# Patient Record
Sex: Female | Born: 1960 | Race: White | Hispanic: No | Marital: Married | Smoking: Current every day smoker
Health system: Southern US, Community
[De-identification: ages and names within clinical notes are randomized; demographics above are authoritative.]

## PROBLEM LIST (undated history)

## (undated) DIAGNOSIS — M25569 Pain in unspecified knee: Secondary | ICD-10-CM

## (undated) HISTORY — PX: LAPAROSCOPIC TUBAL LIGATION: SUR803

---

## 2015-03-29 ENCOUNTER — Emergency Department (HOSPITAL_COMMUNITY): Payer: 59

## 2015-03-29 ENCOUNTER — Encounter (HOSPITAL_COMMUNITY): Payer: Self-pay | Admitting: Emergency Medicine

## 2015-03-29 ENCOUNTER — Inpatient Hospital Stay (HOSPITAL_COMMUNITY)
Admission: EM | Admit: 2015-03-29 | Discharge: 2015-03-31 | DRG: 340 | Disposition: A | Discharge status: Home / Self Care | Payer: 59 | Attending: Surgery | Admitting: Surgery

## 2015-03-29 DIAGNOSIS — K8020 Calculus of gallbladder without cholecystitis without obstruction: Secondary | ICD-10-CM | POA: Diagnosis present

## 2015-03-29 DIAGNOSIS — K3580 Unspecified acute appendicitis: Secondary | ICD-10-CM | POA: Diagnosis present

## 2015-03-29 DIAGNOSIS — K3532 Acute appendicitis with perforation and localized peritonitis, without abscess: Secondary | ICD-10-CM | POA: Diagnosis present

## 2015-03-29 DIAGNOSIS — F17210 Nicotine dependence, cigarettes, uncomplicated: Secondary | ICD-10-CM | POA: Diagnosis present

## 2015-03-29 DIAGNOSIS — K352 Acute appendicitis with generalized peritonitis: Principal | ICD-10-CM | POA: Diagnosis present

## 2015-03-29 DIAGNOSIS — N83202 Unspecified ovarian cyst, left side: Secondary | ICD-10-CM | POA: Diagnosis present

## 2015-03-29 DIAGNOSIS — R1031 Right lower quadrant pain: Secondary | ICD-10-CM | POA: Diagnosis not present

## 2015-03-29 HISTORY — DX: Pain in unspecified knee: M25.569

## 2015-03-29 LAB — CBC
HCT: 43.4 % (ref 36.0–46.0)
Hemoglobin: 15.2 g/dL — ABNORMAL HIGH (ref 12.0–15.0)
MCH: 30.8 pg (ref 26.0–34.0)
MCHC: 35 g/dL (ref 30.0–36.0)
MCV: 88 fL (ref 78.0–100.0)
PLATELETS: 204 10*3/uL (ref 150–400)
RBC: 4.93 MIL/uL (ref 3.87–5.11)
RDW: 13.4 % (ref 11.5–15.5)
WBC: 18.1 10*3/uL — AB (ref 4.0–10.5)

## 2015-03-29 LAB — URINALYSIS, ROUTINE W REFLEX MICROSCOPIC
Bilirubin Urine: NEGATIVE
Glucose, UA: NEGATIVE mg/dL
KETONES UR: NEGATIVE mg/dL
NITRITE: NEGATIVE
PROTEIN: NEGATIVE mg/dL
Specific Gravity, Urine: 1.018 (ref 1.005–1.030)
pH: 6 (ref 5.0–8.0)

## 2015-03-29 LAB — COMPREHENSIVE METABOLIC PANEL
ALBUMIN: 4.2 g/dL (ref 3.5–5.0)
ALK PHOS: 72 U/L (ref 38–126)
ALT: 27 U/L (ref 14–54)
ANION GAP: 9 (ref 5–15)
AST: 23 U/L (ref 15–41)
BUN: 13 mg/dL (ref 6–20)
CALCIUM: 9.8 mg/dL (ref 8.9–10.3)
CO2: 29 mmol/L (ref 22–32)
Chloride: 98 mmol/L — ABNORMAL LOW (ref 101–111)
Creatinine, Ser: 0.73 mg/dL (ref 0.44–1.00)
GFR calc Af Amer: >60 mL/min (ref >60)
GFR calc non Af Amer: >60 mL/min (ref >60)
GLUCOSE: 107 mg/dL — AB (ref 65–99)
Potassium: 3.4 mmol/L — ABNORMAL LOW (ref 3.5–5.1)
SODIUM: 136 mmol/L (ref 135–145)
TOTAL PROTEIN: 8.3 g/dL — AB (ref 6.5–8.1)
Total Bilirubin: 1.1 mg/dL (ref 0.3–1.2)

## 2015-03-29 LAB — URINE MICROSCOPIC-ADD ON

## 2015-03-29 LAB — LIPASE, BLOOD: Lipase: 24 U/L (ref 11–51)

## 2015-03-29 MED ORDER — DIPHENHYDRAMINE HCL 50 MG/ML IJ SOLN: 12.5000 mg | Freq: Four times a day (QID) | INTRAMUSCULAR | Status: DC | PRN

## 2015-03-29 MED ORDER — PROMETHAZINE HCL 25 MG/ML IJ SOLN: 6.2500 mg | INTRAMUSCULAR | Status: DC | PRN

## 2015-03-29 MED ORDER — PHENOL 1.4 % MT LIQD: 2.0000 | OROMUCOSAL | Status: DC | PRN

## 2015-03-29 MED ORDER — HEPARIN SODIUM (PORCINE) 5000 UNIT/ML IJ SOLN
5000.0000 [IU] | Freq: Three times a day (TID) | INTRAMUSCULAR | Status: DC
  Administered 2015-03-29 – 2015-03-31 (×3): 5000 [IU] via SUBCUTANEOUS
  Filled 2015-03-29 (×8): qty 1

## 2015-03-29 MED ORDER — MAGIC MOUTHWASH
15.0000 mL | Freq: Four times a day (QID) | ORAL | Status: DC | PRN
  Filled 2015-03-29: qty 15

## 2015-03-29 MED ORDER — SODIUM CHLORIDE 0.9 % IV BOLUS (SEPSIS)
1000.0000 mL | Freq: Once | INTRAVENOUS | Status: AC
  Administered 2015-03-29: 1000 mL via INTRAVENOUS

## 2015-03-29 MED ORDER — DEXTROSE 5 % IV SOLN
2.0000 g | INTRAVENOUS | Status: DC
  Administered 2015-03-29 – 2015-03-30 (×2): 2 g via INTRAVENOUS
  Filled 2015-03-29 (×2): qty 2

## 2015-03-29 MED ORDER — METRONIDAZOLE IN NACL 5-0.79 MG/ML-% IV SOLN
500.0000 mg | Freq: Three times a day (TID) | INTRAVENOUS | Status: DC
  Administered 2015-03-29 – 2015-03-31 (×4): 500 mg via INTRAVENOUS
  Filled 2015-03-29 (×6): qty 100

## 2015-03-29 MED ORDER — ALUM & MAG HYDROXIDE-SIMETH 200-200-20 MG/5ML PO SUSP: 30.0000 mL | Freq: Four times a day (QID) | ORAL | Status: DC | PRN

## 2015-03-29 MED ORDER — CHLORHEXIDINE GLUCONATE 4 % EX LIQD
1.0000 "application " | Freq: Once | CUTANEOUS | Status: AC
  Administered 2015-03-29: 1 via TOPICAL

## 2015-03-29 MED ORDER — ONDANSETRON HCL 4 MG/2ML IJ SOLN
4.0000 mg | Freq: Four times a day (QID) | INTRAMUSCULAR | Status: DC | PRN
  Administered 2015-03-30 (×2): 4 mg via INTRAVENOUS
  Filled 2015-03-29: qty 2

## 2015-03-29 MED ORDER — METOPROLOL TARTRATE 12.5 MG HALF TABLET
12.5000 mg | ORAL_TABLET | Freq: Two times a day (BID) | ORAL | Status: DC | PRN
  Filled 2015-03-29: qty 1

## 2015-03-29 MED ORDER — SODIUM CHLORIDE 0.9 % IV SOLN
8.0000 mg | Freq: Four times a day (QID) | INTRAVENOUS | Status: DC | PRN
  Filled 2015-03-29: qty 4

## 2015-03-29 MED ORDER — CHLORHEXIDINE GLUCONATE 4 % EX LIQD
1.0000 "application " | Freq: Once | CUTANEOUS | Status: AC
  Administered 2015-03-30: 1 via TOPICAL

## 2015-03-29 MED ORDER — ONDANSETRON 4 MG PO TBDP: 4.0000 mg | ORAL_TABLET | Freq: Four times a day (QID) | ORAL | Status: DC | PRN

## 2015-03-29 MED ORDER — GUAIFENESIN-DM 100-10 MG/5ML PO SYRP: 15.0000 mL | ORAL_SOLUTION | ORAL | Status: DC | PRN

## 2015-03-29 MED ORDER — SACCHAROMYCES BOULARDII 250 MG PO CAPS
250.0000 mg | ORAL_CAPSULE | Freq: Two times a day (BID) | ORAL | Status: DC
  Administered 2015-03-29 – 2015-03-31 (×3): 250 mg via ORAL
  Filled 2015-03-29 (×5): qty 1

## 2015-03-29 MED ORDER — HYDROMORPHONE HCL 1 MG/ML IJ SOLN
0.5000 mg | INTRAMUSCULAR | Status: DC | PRN
  Administered 2015-03-29: 1 mg via INTRAVENOUS
  Filled 2015-03-29: qty 2

## 2015-03-29 MED ORDER — IOHEXOL 300 MG/ML  SOLN
100.0000 mL | Freq: Once | INTRAMUSCULAR | Status: AC | PRN
Start: 2015-03-29 — End: 2015-03-29
  Administered 2015-03-29: 100 mL via INTRAVENOUS

## 2015-03-29 MED ORDER — ACETAMINOPHEN 500 MG PO TABS
1000.0000 mg | ORAL_TABLET | Freq: Three times a day (TID) | ORAL | Status: DC
  Administered 2015-03-29 – 2015-03-31 (×4): 1000 mg via ORAL
  Filled 2015-03-29 (×7): qty 2

## 2015-03-29 MED ORDER — MENTHOL 3 MG MT LOZG: 1.0000 | LOZENGE | OROMUCOSAL | Status: DC | PRN

## 2015-03-29 MED ORDER — ONDANSETRON HCL 4 MG/2ML IJ SOLN
4.0000 mg | Freq: Once | INTRAMUSCULAR | Status: DC
  Filled 2015-03-29 (×3): qty 2

## 2015-03-29 MED ORDER — LIP MEDEX EX OINT
1.0000 "application " | TOPICAL_OINTMENT | Freq: Two times a day (BID) | CUTANEOUS | Status: DC
  Administered 2015-03-29 – 2015-03-31 (×2): 1 via TOPICAL
  Filled 2015-03-29: qty 7

## 2015-03-29 MED ORDER — OXYCODONE HCL 5 MG PO TABS: 5.0000 mg | ORAL_TABLET | ORAL | Status: DC | PRN

## 2015-03-29 MED ORDER — ZOLPIDEM TARTRATE 5 MG PO TABS: 5.0000 mg | ORAL_TABLET | Freq: Every evening | ORAL | Status: DC | PRN

## 2015-03-29 MED ORDER — METOPROLOL TARTRATE 1 MG/ML IV SOLN
5.0000 mg | Freq: Four times a day (QID) | INTRAVENOUS | Status: DC | PRN
  Filled 2015-03-29: qty 5

## 2015-03-29 NOTE — ED Notes (Signed)
Pt denies nausea; refuses zofran.

## 2015-03-29 NOTE — H&P (Signed)
[address] [place], [place] [address] Phone: [phone] FAX: [phone]     Ann Reyes  1960-09-19 [id number]  CARE TEAM:  PCP: Michel Harrow, PA-C  Outpatient Care Team: Patient Care Team: Michel Harrow, PA-C as PCP - General (Physician Assistant)  Inpatient Treatment Team: Treatment Team: Attending Provider: Nolon Nations, MD; Technician: Tobie Poet, NT; Technician: Loman Brooklyn, EMT; Registered Nurse: Justice Rocher, RN  This patient is a 54 y.o.female who presents today for surgical evaluation at the request of Dr. Dayna Barker  Reason for evaluation: Acute appendicitis.  Healthy wound.  Usually does not see doctors.  Has had a few days of vague abdominal pain.  Usually not abdomen.  Nausea and vomiting.  Became more focused in the lower abdomen.  More right-sided.  Was concerned.  Gait emergency room.  Physical exam concerning.  CT scan suspicious for acute appendicitis.  Consultation for surgery requested.  No personal nor family history of GI/colon cancer, inflammatory bowel disease, irritable bowel syndrome, allergy such as Celiac Sprue, dietary/dairy problems, colitis, ulcers nor gastritis.  No recent sick contacts/gastroenteritis.  No travel outside the country.  No changes in diet.  No dysphagia to solids or liquids.  No significant heartburn or reflux.  No hematochezia, hematemesis, coffee ground emesis.  No evidence of prior gastric/peptic ulceration.   No exertional chest/neck/shoulder/arm pain.  Patient can walk 60 minutes for about 2 miles without difficulty.  Does not see doctors.  Setting up a PCP     Past Medical History  Diagnosis Date  . Knee pain     Past Surgical History  Procedure Laterality Date  . No past surgeries      Social History   Social History  . Marital Status: Married    Spouse Name: N/A  . Number of Children: N/A  . Years of Education: N/A   Occupational  History  . Not on file.   Social History Main Topics  . Smoking status: Current Every Day Smoker -- 1.00 packs/day for 20 years    Types: Cigarettes  . Smokeless tobacco: Never Used  . Alcohol Use: No  . Drug Use: No  . Sexual Activity: Not on file   Other Topics Concern  . Not on file   Social History Narrative  . No narrative on file    History reviewed. No pertinent family history.  Current Facility-Administered Medications  Medication Dose Route Frequency Provider Last Rate Last Dose  . acetaminophen (TYLENOL) tablet 1,000 mg  1,000 mg Oral TID Michael Boston, MD      . alum & mag hydroxide-simeth (MAALOX/MYLANTA) 200-200-20 MG/5ML suspension 30 mL  30 mL Oral Q6H PRN Michael Boston, MD      . cefTRIAXone (ROCEPHIN) 2 g in dextrose 5 % 50 mL IVPB  2 g Intravenous Q24H Michael Boston, MD       And  . metroNIDAZOLE (FLAGYL) IVPB 500 mg  500 mg Intravenous Q8H Michael Boston, MD      . chlorhexidine (HIBICLENS) 4 % liquid 1 application  1 application Topical Once Michael Boston, MD      . Derrill Memo ON 03/30/2015] chlorhexidine (HIBICLENS) 4 % liquid 1 application  1 application Topical Once Michael Boston, MD      . diphenhydrAMINE (BENADRYL) injection 12.5-25 mg  12.5-25 mg Intravenous Q6H PRN Michael Boston, MD      . guaiFENesin-dextromethorphan (ROBITUSSIN DM) 100-10 MG/5ML syrup 15 mL  15  mL Oral Q4H PRN Michael Boston, MD      . heparin injection 5,000 Units  5,000 Units Subcutaneous 3 times per day Michael Boston, MD      . HYDROmorphone (DILAUDID) injection 0.5-2 mg  0.5-2 mg Intravenous Q1H PRN Michael Boston, MD      . lip balm (CARMEX) ointment 1 application  1 application Topical BID Michael Boston, MD      . magic mouthwash  15 mL Oral QID PRN Michael Boston, MD      . menthol-cetylpyridinium (CEPACOL) lozenge 3 mg  1 lozenge Oral PRN Michael Boston, MD      . metoprolol (LOPRESSOR) injection 5 mg  5 mg Intravenous Q6H PRN Michael Boston, MD      . metoprolol tartrate (LOPRESSOR) tablet 12.5 mg   12.5 mg Oral Q12H PRN Michael Boston, MD      . ondansetron [facility]) injection 4 mg  4 mg Intravenous Q6H PRN Michael Boston, MD       Or  . ondansetron (ZOFRAN) 8 mg in sodium chloride 0.9 % 50 mL IVPB  8 mg Intravenous Q6H PRN Michael Boston, MD      . ondansetron [facility]) injection 4 mg  4 mg Intravenous Once Merrily Pew, MD   4 mg at 03/29/15 1834  . ondansetron (ZOFRAN-ODT) disintegrating tablet 4-8 mg  4-8 mg Oral Q6H PRN Michael Boston, MD      . oxyCODONE (Oxy IR/ROXICODONE) immediate release tablet 5-10 mg  5-10 mg Oral Q4H PRN Michael Boston, MD      . phenol (CHLORASEPTIC) mouth spray 2 spray  2 spray Mouth/Throat PRN Michael Boston, MD      . promethazine (PHENERGAN) injection 6.25-12.5 mg  6.25-12.5 mg Intravenous Q4H PRN Michael Boston, MD      . saccharomyces boulardii (FLORASTOR) capsule 250 mg  250 mg Oral BID Michael Boston, MD      . zolpidem [facility]) tablet 5-10 mg  5-10 mg Oral QHS PRN Michael Boston, MD       No current outpatient prescriptions on file.     No Known Allergies  ROS: Constitutional:  No fevers, chills, sweats.  Weight stable Eyes:  No vision changes, No discharge HENT:  No sore throats, nasal drainage Lymph: No neck swelling, No bruising easily Pulmonary:  No cough, productive sputum CV: No orthopnea, PND  Patient walks 60 minutes for about 2 miles without difficulty.  No exertional chest/neck/shoulder/arm pain. GI:  No personal nor family history of GI/colon cancer, inflammatory bowel disease, irritable bowel syndrome, allergy such as Celiac Sprue, dietary/dairy problems, colitis, ulcers nor gastritis.  No recent sick contacts/gastroenteritis.  No travel outside the country.  No changes in diet. Renal: No UTIs, No hematuria Genital:  No drainage, bleeding, masses Musculoskeletal: No severe joint pain.  Good ROM major joints Skin:  No sores or lesions.  No rashes Heme/Lymph:  No easy bleeding.  No swollen lymph nodes Neuro: No focal weakness/numbness.  No  seizures Psych: No suicidal ideation.  No hallucinations  BP 90/79 mmHg  Pulse 94  Temp(Src) 98.9 F (37.2 C) (Oral)  Resp 16  Ht 5' 2"  (1.575 m)  Wt 74.844 kg (165 lb)  BMI 30.17 kg/m2  SpO2 96%  Physical Exam: General: Pt awake/alert/oriented x4 in  no major acute distress Eyes: PERRL, normal EOM. Sclera nonicteric Neuro: CN II-XII intact w/o focal sensory/motor deficits. Lymph: No head/neck/groin lymphadenopathy Psych:  No delerium/psychosis/paranoia HENT: Normocephalic, Mucus membranes moist.  No thrush Neck: Supple, No tracheal  deviation Chest: No pain.  Good respiratory excursion. CV:  Pulses intact.   Regular rhythm Abdomen: Soft, Nondistended.  TTP RLQ at McBurney's point w guarding & pain with cough/bedshake.  Rest of abdomen nontender.  No incarcerated hernias. Ext:  SCDs BLE.  No significant edema.  No cyanosis Skin: No petechiae / purpurea.  No major sores Musculoskeletal: No severe joint pain.  Good ROM major joints   Results:   Labs: Results for orders placed or performed during the hospital encounter of 03/29/15 (from the past 48 hour(s))  Lipase, blood     Status: None   Collection Time: 03/29/15  5:00 PM  Result Value Ref Range   Lipase 24 11 - 51 U/L  Comprehensive metabolic panel     Status: Abnormal   Collection Time: 03/29/15  5:00 PM  Result Value Ref Range   Sodium 136 135 - 145 mmol/L   Potassium 3.4 (L) 3.5 - 5.1 mmol/L   Chloride 98 (L) 101 - 111 mmol/L   CO2 29 22 - 32 mmol/L   Glucose, Bld 107 (H) 65 - 99 mg/dL   BUN 13 6 - 20 mg/dL   Creatinine, Ser 0.73 0.44 - 1.00 mg/dL   Calcium 9.8 8.9 - 10.3 mg/dL   Total Protein 8.3 (H) 6.5 - 8.1 g/dL   Albumin 4.2 3.5 - 5.0 g/dL   AST 23 15 - 41 U/L   ALT 27 14 - 54 U/L   Alkaline Phosphatase 72 38 - 126 U/L   Total Bilirubin 1.1 0.3 - 1.2 mg/dL   GFR calc non Af Amer >60 >60 mL/min   GFR calc Af Amer >60 >60 mL/min    Comment: (NOTE) The eGFR has been calculated using the CKD EPI  equation. This calculation has not been validated in all clinical situations. eGFR's persistently <60 mL/min signify possible Chronic Kidney Disease.    Anion gap 9 5 - 15  CBC     Status: Abnormal   Collection Time: 03/29/15  5:00 PM  Result Value Ref Range   WBC 18.1 (H) 4.0 - 10.5 K/uL   RBC 4.93 3.87 - 5.11 MIL/uL   Hemoglobin 15.2 (H) 12.0 - 15.0 g/dL   HCT 43.4 36.0 - 46.0 %   MCV 88.0 78.0 - 100.0 fL   MCH 30.8 26.0 - 34.0 pg   MCHC 35.0 30.0 - 36.0 g/dL   RDW 13.4 11.5 - 15.5 %   Platelets 204 150 - 400 K/uL  Urinalysis, Routine w reflex microscopic (not at [facility])     Status: Abnormal   Collection Time: 03/29/15  7:42 PM  Result Value Ref Range   Color, Urine YELLOW YELLOW   APPearance CLEAR CLEAR   Specific Gravity, Urine 1.018 1.005 - 1.030   pH 6.0 5.0 - 8.0   Glucose, UA NEGATIVE NEGATIVE mg/dL   Hgb urine dipstick MODERATE (A) NEGATIVE   Bilirubin Urine NEGATIVE NEGATIVE   Ketones, ur NEGATIVE NEGATIVE mg/dL   Protein, ur NEGATIVE NEGATIVE mg/dL   Nitrite NEGATIVE NEGATIVE   Leukocytes, UA SMALL (A) NEGATIVE  Urine microscopic-add on     Status: Abnormal   Collection Time: 03/29/15  7:42 PM  Result Value Ref Range   Squamous Epithelial / LPF 6-30 (A) NONE SEEN   WBC, UA 6-30 0 - 5 WBC/hpf   RBC / HPF 0-5 0 - 5 RBC/hpf   Bacteria, UA FEW (A) NONE SEEN    Imaging / Studies: Ct Abdomen Pelvis W Contrast  03/29/2015  CLINICAL DATA:  Central abdominal pain shifting to the right lower quadrant. Elevated white blood cell count. Concern for appendicitis. EXAM: CT ABDOMEN AND PELVIS WITH CONTRAST TECHNIQUE: Multidetector CT imaging of the abdomen and pelvis was performed using the standard protocol following bolus administration of intravenous contrast. CONTRAST:  171m OMNIPAQUE IOHEXOL 300 MG/ML  SOLN COMPARISON:  None. FINDINGS: Lower chest:  The included lung bases are clear. Liver: Mild steatosis. Subcapsular 1.7 cm peripherally enhancing lesion in the right lobe  consistent with hemangioma. 81.4 cm hypodense lesion adjacent the gallbladder fossa with peripheral nodular enhancement, also likely hemangioma. Smaller subcentimeter hypodensities scattered throughout the liver parenchyma, nonspecific. Hepatobiliary: Gallbladder physiologically distended. At least 2 lamellated gallstones within the gallbladder fossa. No pericholecystic inflammatory change. No biliary dilatation. Pancreas: Normal. Spleen: Normal. Adrenal glands: No nodule. Kidneys: Symmetric renal enhancement. No hydronephrosis. Mild prominence of the right ureter, likely reactive. Tiny subcapsular hypodensity in the interpolar right kidney, too small to characterize. Stomach/Bowel: Stomach physiologically distended. There are no dilated or thickened small bowel loops. Small volume of stool throughout the colon without colonic wall thickening. Scattered diverticulosis throughout the distal colon without diverticulitis. Appendix: Distended measuring up to 12 mm with questionable appendicolith at the base. There is moderate surrounding periappendiceal fat stranding and small amount of free fluid. Small foci of air within the appendiceal lumen without frank perforation or extraluminal air. No abscess. Vascular/Lymphatic: No retroperitoneal adenopathy. Abdominal aorta is normal in caliber. Mild atherosclerosis without aneurysm. Reproductive: Uterus is normal in size. Left ovarian enlargement measuring 4.3 x 3.2 cm with septated cyst versus adjacent cysts. Right ovary normal in size. Bladder: Physiologically distended without wall thickening. Other: No free air or intra-abdominal fluid collection. Tiny fat containing umbilical hernia. Musculoskeletal: There are no acute or suspicious osseous abnormalities. Facet arthropathy in the lumbar spine. IMPRESSION: 1. Acute appendicitis.  No abscess or perforation. 2. Left ovarian enlargement with septated cyst versus adjacent cysts. Recommend pelvic ultrasound for further  characterization after acute illness has resolved. 3. Incidental findings include cholelithiasis, no findings of acute cholecystitis. Diverticulosis. Hepatic hemangiomas. Electronically Signed   By: MJeb LeveringM.D.   On: 03/29/2015 19:38    Medications / Allergies: per chart  Antibiotics: Anti-infectives    Start     Dose/Rate Route Frequency Ordered Stop   03/29/15 2045  cefTRIAXone (ROCEPHIN) 2 g in dextrose 5 % 50 mL IVPB     2 g 100 mL/hr over 30 Minutes Intravenous Every 24 hours 03/29/15 2030     03/29/15 2045  metroNIDAZOLE (FLAGYL) IVPB 500 mg     500 mg 100 mL/hr over 60 Minutes Intravenous Every 8 hours 03/29/15 2030        Assessment  Ann Reyes 54y.o. female       Problem List:  Principal Problem:   Acute appendicitis Active Problems:   Cholelithiasis   Ovarian cyst, left   Acute appendicitis by history physical and CT scan  Plan:  Admit.  IV antibiotics.  IV fluids.  Evaluation with appendectomy:  The anatomy & physiology of the digestive tract was discussed.  The pathophysiology of appendicitis and other appendiceal disorders were discussed.  Natural history risks without surgery was discussed.   I feel the risks of no intervention will lead to serious problems that outweigh the operative risks; therefore, I recommended diagnostic laparoscopy with removal of appendix to remove the pathology.  Laparoscopic & open techniques were discussed.   I noted a good likelihood this will help address  the problem.   Risks such as bleeding, infection, abscess, leak, reoperation, injury to other organs, need for repair of tissues / organs, possible ostomy, hernia, heart attack, stroke, death, and other risks were discussed.  Goals of post-operative recovery were discussed as well.  We will work to minimize complications.  Questions were answered.  The patient expresses understanding & wishes to proceed with surgery.  Plan in the AM  -VTE prophylaxis- SCDs,  etc -mobilize as tolerated to help recovery  Increase activity as tolerated to regular activity.  Low impact exercise such as walking an hour a day at least ideal.    Bowel regimen with 30 g fiber a day and fiber supplement as needed to avoid problems.  Return to clinic as needed.     Consider screening for malignancies (breast, colon, melanoma, etc) as appropriate.   May nned f/u ultrasound w ovarian cyst - defer to ED MD & PCP / Gyn  Questions answered.  The patient expressed understanding and appreciation      Adin Hector, M.D., F.A.C.S. Gastrointestinal and Minimally Invasive Surgery [facility] [address], [place], [place] [address] [phone] Main / Paging   03/29/2015  Note: Portions of this report may have been transcribed using voice recognition software. Every effort was made to ensure accuracy; however, inadvertent computerized transcription errors may be present.   Any transcriptional errors that result from this process are unintentional.

## 2015-03-29 NOTE — ED Provider Notes (Signed)
CSN: [id number]     Arrival date & time 03/29/15  1511 History   First MD Initiated Contact with Patient 03/29/15 1718     Chief Complaint  Patient presents with  . Abdominal Pain  . Emesis     (Consider location/radiation/quality/duration/timing/severity/associated sxs/prior Treatment) Patient is a 54 y.o. female presenting with abdominal pain and vomiting.  Abdominal Pain Pain location:  Epigastric and RLQ Pain quality: aching and sharp   Pain severity:  Mild Onset quality:  Gradual Duration:  1 day Timing:  Constant Progression:  Worsening Chronicity:  New Context: not alcohol use and not diet changes   Relieved by:  None tried Worsened by:  Nothing tried Ineffective treatments:  None tried Associated symptoms: anorexia, chills, fever, nausea and vomiting   Associated symptoms: no cough, no dysuria and no shortness of breath   Emesis Associated symptoms: abdominal pain and chills   Associated symptoms: no myalgias     Past Medical History  Diagnosis Date  . Knee pain    Past Surgical History  Procedure Laterality Date  . Laparoscopic tubal ligation     History reviewed. No pertinent family history. Social History  Substance Use Topics  . Smoking status: Current Every Day Smoker -- 1.00 packs/day for 20 years    Types: Cigarettes  . Smokeless tobacco: Never Used  . Alcohol Use: No   OB History    No data available     Review of Systems  Constitutional: Positive for fever and chills.  Eyes: Negative for pain and redness.  Respiratory: Negative for cough and shortness of breath.   Gastrointestinal: Positive for nausea, vomiting, abdominal pain and anorexia.  Endocrine: Negative for polydipsia and polyuria.  Genitourinary: Negative for dysuria.  Musculoskeletal: Negative for myalgias and neck pain.  All other systems reviewed and are negative.     Allergies  Review of patient's allergies indicates no known allergies.  Home Medications   Prior to  Admission medications   Not on File   BP 89/64 mmHg  Pulse 94  Temp(Src) 98.7 F (37.1 C) (Oral)  Resp 18  Ht  (1.575 m)  Wt 165 lb (74.844 kg)  BMI 30.17 kg/m2  SpO2 98% Physical Exam  Constitutional: She is oriented to person, place, and time. She appears well-developed and well-nourished.  HENT:  Head: Normocephalic and atraumatic.  Eyes: Conjunctivae are normal. Pupils are equal, round, and reactive to light.  Neck: Normal range of motion.  Cardiovascular: Normal rate and regular rhythm.   Pulmonary/Chest: Effort normal and breath sounds normal. No stridor. No respiratory distress.  Abdominal: Soft. She exhibits no distension. There is tenderness (RLQ only). There is no rebound and no guarding.  Neurological: She is alert and oriented to person, place, and time.  Skin: Skin is warm and dry.  Nursing note and vitals reviewed.   ED Course  Procedures (including critical care time) Labs Review Labs Reviewed  COMPREHENSIVE METABOLIC PANEL - Abnormal; Notable for the following:    Potassium 3.4 (*)    Chloride 98 (*)    Glucose, Bld 107 (*)    Total Protein 8.3 (*)    All other components within normal limits  CBC - Abnormal; Notable for the following:    WBC 18.1 (*)    Hemoglobin 15.2 (*)    All other components within normal limits  URINALYSIS, ROUTINE W REFLEX MICROSCOPIC (NOT AT [facility]) - Abnormal; Notable for the following:    Hgb urine dipstick MODERATE (*)  Leukocytes, UA SMALL (*)    All other components within normal limits  URINE MICROSCOPIC-ADD ON - Abnormal; Notable for the following:    Squamous Epithelial / LPF 6-30 (*)    Bacteria, UA FEW (*)    All other components within normal limits  LIPASE, BLOOD    Imaging Review Ct Abdomen Pelvis W Contrast  03/29/2015  CLINICAL DATA:  Central abdominal pain shifting to the right lower quadrant. Elevated white blood cell count. Concern for appendicitis. EXAM: CT ABDOMEN AND PELVIS WITH CONTRAST  TECHNIQUE: Multidetector CT imaging of the abdomen and pelvis was performed using the standard protocol following bolus administration of intravenous contrast. CONTRAST:  100mL OMNIPAQUE IOHEXOL 300 MG/ML  SOLN COMPARISON:  None. FINDINGS: Lower chest:  The included lung bases are clear. Liver: Mild steatosis. Subcapsular 1.7 cm peripherally enhancing lesion in the right lobe consistent with hemangioma. 81.4 cm hypodense lesion adjacent the gallbladder fossa with peripheral nodular enhancement, also likely hemangioma. Smaller subcentimeter hypodensities scattered throughout the liver parenchyma, nonspecific. Hepatobiliary: Gallbladder physiologically distended. At least 2 lamellated gallstones within the gallbladder fossa. No pericholecystic inflammatory change. No biliary dilatation. Pancreas: Normal. Spleen: Normal. Adrenal glands: No nodule. Kidneys: Symmetric renal enhancement. No hydronephrosis. Mild prominence of the right ureter, likely reactive. Tiny subcapsular hypodensity in the interpolar right kidney, too small to characterize. Stomach/Bowel: Stomach physiologically distended. There are no dilated or thickened small bowel loops. Small volume of stool throughout the colon without colonic wall thickening. Scattered diverticulosis throughout the distal colon without diverticulitis. Appendix: Distended measuring up to 12 mm with questionable appendicolith at the base. There is moderate surrounding periappendiceal fat stranding and small amount of free fluid. Small foci of air within the appendiceal lumen without frank perforation or extraluminal air. No abscess. Vascular/Lymphatic: No retroperitoneal adenopathy. Abdominal aorta is normal in caliber. Mild atherosclerosis without aneurysm. Reproductive: Uterus is normal in size. Left ovarian enlargement measuring 4.3 x 3.2 cm with septated cyst versus adjacent cysts. Right ovary normal in size. Bladder: Physiologically distended without wall thickening. Other:  No free air or intra-abdominal fluid collection. Tiny fat containing umbilical hernia. Musculoskeletal: There are no acute or suspicious osseous abnormalities. Facet arthropathy in the lumbar spine. IMPRESSION: 1. Acute appendicitis.  No abscess or perforation. 2. Left ovarian enlargement with septated cyst versus adjacent cysts. Recommend pelvic ultrasound for further characterization after acute illness has resolved. 3. Incidental findings include cholelithiasis, no findings of acute cholecystitis. Diverticulosis. Hepatic hemangiomas. Electronically Signed   By: Rubye OaksMelanie  Ehinger M.D.   On: 03/29/2015 19:38   I have personally reviewed and evaluated these images and lab results as part of my medical decision-making.   EKG Interpretation None      MDM   Final diagnoses:  Acute appendicitis, unspecified acute appendicitis type   appy vs early gstroenteritis. Fluids, zofran, CT, reeval.   CT with evidence of uncomplicated appendicitis. D/W surgery, who will admit.     Marily MemosJason Kevork Joyce, MD 03/29/15 [phone]

## 2015-03-29 NOTE — ED Notes (Signed)
Pt states cramps down center of stomach with vomiting x 3 throughout the night starting x 2 days ago. Pain then shifted to lower RLQ. Seen at PCP office today and referred here with elevated WBC for possibility of appendicitis.

## 2015-03-30 ENCOUNTER — Observation Stay (HOSPITAL_COMMUNITY): Payer: 59 | Admitting: Anesthesiology

## 2015-03-30 ENCOUNTER — Encounter (HOSPITAL_COMMUNITY): Payer: Self-pay | Admitting: Certified Registered"

## 2015-03-30 ENCOUNTER — Encounter (HOSPITAL_COMMUNITY): Admission: EM | Disposition: A | Discharge status: Home / Self Care | Payer: Self-pay | Source: Home / Self Care

## 2015-03-30 DIAGNOSIS — K352 Acute appendicitis with generalized peritonitis: Secondary | ICD-10-CM | POA: Diagnosis present

## 2015-03-30 DIAGNOSIS — F17210 Nicotine dependence, cigarettes, uncomplicated: Secondary | ICD-10-CM | POA: Diagnosis present

## 2015-03-30 DIAGNOSIS — K8020 Calculus of gallbladder without cholecystitis without obstruction: Secondary | ICD-10-CM | POA: Diagnosis present

## 2015-03-30 DIAGNOSIS — K3532 Acute appendicitis with perforation and localized peritonitis, without abscess: Secondary | ICD-10-CM | POA: Diagnosis present

## 2015-03-30 DIAGNOSIS — N83202 Unspecified ovarian cyst, left side: Secondary | ICD-10-CM | POA: Diagnosis present

## 2015-03-30 DIAGNOSIS — R1031 Right lower quadrant pain: Secondary | ICD-10-CM | POA: Diagnosis present

## 2015-03-30 HISTORY — PX: LAPAROSCOPIC APPENDECTOMY: SHX408

## 2015-03-30 LAB — MRSA PCR SCREENING: MRSA BY PCR: NEGATIVE

## 2015-03-30 SURGERY — APPENDECTOMY, LAPAROSCOPIC
Anesthesia: General | Site: Abdomen

## 2015-03-30 MED ORDER — PHENYLEPHRINE HCL 10 MG/ML IJ SOLN
INTRAMUSCULAR | Status: DC | PRN
  Administered 2015-03-30 (×2): 40 ug via INTRAVENOUS

## 2015-03-30 MED ORDER — DEXAMETHASONE SODIUM PHOSPHATE 10 MG/ML IJ SOLN
INTRAMUSCULAR | Status: DC | PRN
  Administered 2015-03-30: 10 mg via INTRAVENOUS

## 2015-03-30 MED ORDER — 0.9 % SODIUM CHLORIDE (POUR BTL) OPTIME
TOPICAL | Status: DC | PRN
  Administered 2015-03-30: 1000 mL

## 2015-03-30 MED ORDER — BUPIVACAINE-EPINEPHRINE 0.25% -1:200000 IJ SOLN
INTRAMUSCULAR | Status: DC | PRN
  Administered 2015-03-30: 50 mL

## 2015-03-30 MED ORDER — MEPERIDINE HCL 50 MG/ML IJ SOLN: 6.2500 mg | INTRAMUSCULAR | Status: DC | PRN

## 2015-03-30 MED ORDER — DEXAMETHASONE SODIUM PHOSPHATE 10 MG/ML IJ SOLN
INTRAMUSCULAR | Status: AC
  Filled 2015-03-30: qty 1

## 2015-03-30 MED ORDER — ONDANSETRON HCL 4 MG/2ML IJ SOLN: 4.0000 mg | Freq: Once | INTRAMUSCULAR | Status: DC | PRN

## 2015-03-30 MED ORDER — KETOROLAC TROMETHAMINE 30 MG/ML IJ SOLN
INTRAMUSCULAR | Status: DC | PRN
  Administered 2015-03-30: 30 mg via INTRAVENOUS

## 2015-03-30 MED ORDER — MIDAZOLAM HCL 5 MG/5ML IJ SOLN
INTRAMUSCULAR | Status: DC | PRN
  Administered 2015-03-30: 2 mg via INTRAVENOUS

## 2015-03-30 MED ORDER — ONDANSETRON HCL 4 MG/2ML IJ SOLN
INTRAMUSCULAR | Status: DC | PRN
  Administered 2015-03-30: 4 mg via INTRAVENOUS

## 2015-03-30 MED ORDER — AMOXICILLIN-POT CLAVULANATE 875-125 MG PO TABS: 1.0000 | ORAL_TABLET | Freq: Two times a day (BID) | ORAL | Status: AC

## 2015-03-30 MED ORDER — PROPOFOL 10 MG/ML IV BOLUS
INTRAVENOUS | Status: AC
  Filled 2015-03-30: qty 20

## 2015-03-30 MED ORDER — ACETAMINOPHEN 325 MG PO TABS: 325.0000 mg | ORAL_TABLET | Freq: Four times a day (QID) | ORAL | Status: DC | PRN

## 2015-03-30 MED ORDER — FENTANYL CITRATE (PF) 250 MCG/5ML IJ SOLN
INTRAMUSCULAR | Status: AC
  Filled 2015-03-30: qty 5

## 2015-03-30 MED ORDER — LACTATED RINGERS IV BOLUS (SEPSIS): 1000.0000 mL | Freq: Three times a day (TID) | INTRAVENOUS | Status: DC | PRN

## 2015-03-30 MED ORDER — LACTATED RINGERS IR SOLN
Status: DC | PRN
  Administered 2015-03-30: 3000 mL
  Administered 2015-03-30: 1000 mL

## 2015-03-30 MED ORDER — KETOROLAC TROMETHAMINE 30 MG/ML IJ SOLN
INTRAMUSCULAR | Status: AC
  Filled 2015-03-30: qty 1

## 2015-03-30 MED ORDER — FENTANYL CITRATE (PF) 100 MCG/2ML IJ SOLN
INTRAMUSCULAR | Status: DC | PRN
  Administered 2015-03-30: 50 ug via INTRAVENOUS
  Administered 2015-03-30: 100 ug via INTRAVENOUS
  Administered 2015-03-30 (×2): 50 ug via INTRAVENOUS

## 2015-03-30 MED ORDER — ONDANSETRON HCL 4 MG/2ML IJ SOLN
INTRAMUSCULAR | Status: AC
  Filled 2015-03-30: qty 2

## 2015-03-30 MED ORDER — SODIUM CHLORIDE 0.9 % IV SOLN: INTRAVENOUS | Status: DC

## 2015-03-30 MED ORDER — LIDOCAINE HCL (CARDIAC) 20 MG/ML IV SOLN
INTRAVENOUS | Status: DC | PRN
  Administered 2015-03-30: 50 mg via INTRAVENOUS

## 2015-03-30 MED ORDER — SUCCINYLCHOLINE CHLORIDE 20 MG/ML IJ SOLN
INTRAMUSCULAR | Status: DC | PRN
  Administered 2015-03-30: 100 mg via INTRAVENOUS

## 2015-03-30 MED ORDER — LIDOCAINE HCL (CARDIAC) 20 MG/ML IV SOLN
INTRAVENOUS | Status: AC
  Filled 2015-03-30: qty 5

## 2015-03-30 MED ORDER — NAPROXEN 500 MG PO TABS
500.0000 mg | ORAL_TABLET | Freq: Two times a day (BID) | ORAL | Status: DC
  Administered 2015-03-30 – 2015-03-31 (×2): 500 mg via ORAL
  Filled 2015-03-30 (×4): qty 1

## 2015-03-30 MED ORDER — PROPOFOL 10 MG/ML IV BOLUS
INTRAVENOUS | Status: DC | PRN
  Administered 2015-03-30: 180 mg via INTRAVENOUS

## 2015-03-30 MED ORDER — PHENYLEPHRINE 40 MCG/ML (10ML) SYRINGE FOR IV PUSH (FOR BLOOD PRESSURE SUPPORT)
PREFILLED_SYRINGE | INTRAVENOUS | Status: AC
  Filled 2015-03-30: qty 10

## 2015-03-30 MED ORDER — SUGAMMADEX SODIUM 200 MG/2ML IV SOLN
INTRAVENOUS | Status: DC | PRN
  Administered 2015-03-30: 200 mg via INTRAVENOUS

## 2015-03-30 MED ORDER — BUPIVACAINE-EPINEPHRINE 0.25% -1:200000 IJ SOLN
INTRAMUSCULAR | Status: AC
  Filled 2015-03-30: qty 1

## 2015-03-30 MED ORDER — SODIUM CHLORIDE 0.9 % IV SOLN
INTRAVENOUS | Status: DC | PRN
  Administered 2015-03-30 (×2): via INTRAVENOUS

## 2015-03-30 MED ORDER — EPHEDRINE SULFATE 50 MG/ML IJ SOLN
INTRAMUSCULAR | Status: DC | PRN
  Administered 2015-03-30: 5 mg via INTRAVENOUS

## 2015-03-30 MED ORDER — MIDAZOLAM HCL 2 MG/2ML IJ SOLN
INTRAMUSCULAR | Status: AC
  Filled 2015-03-30: qty 2

## 2015-03-30 MED ORDER — ROCURONIUM BROMIDE 100 MG/10ML IV SOLN
INTRAVENOUS | Status: AC
Start: 2015-03-30 — End: 2015-03-30
  Filled 2015-03-30: qty 1

## 2015-03-30 MED ORDER — ROCURONIUM BROMIDE 100 MG/10ML IV SOLN
INTRAVENOUS | Status: DC | PRN
  Administered 2015-03-30: 30 mg via INTRAVENOUS

## 2015-03-30 MED ORDER — SUGAMMADEX SODIUM 200 MG/2ML IV SOLN
INTRAVENOUS | Status: AC
  Filled 2015-03-30: qty 2

## 2015-03-30 MED ORDER — HYDROMORPHONE HCL 1 MG/ML IJ SOLN: 0.2500 mg | INTRAMUSCULAR | Status: DC | PRN

## 2015-03-30 MED ORDER — ACETAMINOPHEN 650 MG RE SUPP: 650.0000 mg | Freq: Four times a day (QID) | RECTAL | Status: DC | PRN

## 2015-03-30 SURGICAL SUPPLY — 38 items
APPLIER CLIP 5 13 M/L LIGAMAX5 (MISCELLANEOUS)
APPLIER CLIP ROT 10 11.4 M/L (STAPLE)
CLIP APPLIE 5 13 M/L LIGAMAX5 (MISCELLANEOUS) IMPLANT
CLIP APPLIE ROT 10 11.4 M/L (STAPLE) IMPLANT
COVER SURGICAL LIGHT HANDLE (MISCELLANEOUS) ×3 IMPLANT
CUTTER FLEX LINEAR 45M (STAPLE) ×3 IMPLANT
DECANTER SPIKE VIAL GLASS SM (MISCELLANEOUS) ×3 IMPLANT
DEVICE TROCAR PUNCTURE CLOSURE (ENDOMECHANICALS) IMPLANT
DRAPE LAPAROSCOPIC ABDOMINAL (DRAPES) ×3 IMPLANT
DRAPE WARM FLUID 44X44 (DRAPE) ×3 IMPLANT
DRSG TEGADERM 2-3/8X2-3/4 SM (GAUZE/BANDAGES/DRESSINGS) ×3 IMPLANT
DRSG TEGADERM 4X4.75 (GAUZE/BANDAGES/DRESSINGS) ×3 IMPLANT
ELECT REM PT RETURN 9FT ADLT (ELECTROSURGICAL) ×3
ELECTRODE REM PT RTRN 9FT ADLT (ELECTROSURGICAL) ×1 IMPLANT
ENDOLOOP SUT PDS II  0 18 (SUTURE)
ENDOLOOP SUT PDS II 0 18 (SUTURE) IMPLANT
GAUZE SPONGE 2X2 8PLY STRL LF (GAUZE/BANDAGES/DRESSINGS) ×1 IMPLANT
GLOVE ECLIPSE 8.0 STRL XLNG CF (GLOVE) ×3 IMPLANT
GLOVE INDICATOR 8.0 STRL GRN (GLOVE) ×3 IMPLANT
GOWN STRL REUS W/TWL XL LVL3 (GOWN DISPOSABLE) ×9 IMPLANT
KIT BASIN OR (CUSTOM PROCEDURE TRAY) ×3 IMPLANT
PEN SKIN MARKING BROAD (MISCELLANEOUS) ×3 IMPLANT
POUCH SPECIMEN RETRIEVAL 10MM (ENDOMECHANICALS) ×3 IMPLANT
RELOAD 45 VASCULAR/THIN (ENDOMECHANICALS) IMPLANT
RELOAD STAPLE TA45 3.5 REG BLU (ENDOMECHANICALS) ×3 IMPLANT
SCISSORS LAP 5X35 DISP (ENDOMECHANICALS) ×3 IMPLANT
SET IRRIG TUBING LAPAROSCOPIC (IRRIGATION / IRRIGATOR) ×3 IMPLANT
SHEARS HARMONIC ACE PLUS 36CM (ENDOMECHANICALS) ×3 IMPLANT
SLEEVE XCEL OPT CAN 5 100 (ENDOMECHANICALS) ×3 IMPLANT
SPONGE GAUZE 2X2 STER 10/PKG (GAUZE/BANDAGES/DRESSINGS) ×2
SUT MNCRL AB 4-0 PS2 18 (SUTURE) ×3 IMPLANT
SUT SILK 2 0 SH (SUTURE) IMPLANT
TOWEL OR 17X26 10 PK STRL BLUE (TOWEL DISPOSABLE) ×3 IMPLANT
TRAY FOLEY W/METER SILVER 14FR (SET/KITS/TRAYS/PACK) ×3 IMPLANT
TRAY FOLEY W/METER SILVER 16FR (SET/KITS/TRAYS/PACK) ×3 IMPLANT
TRAY LAPAROSCOPIC (CUSTOM PROCEDURE TRAY) ×3 IMPLANT
TROCAR BLADELESS OPT 5 100 (ENDOMECHANICALS) ×3 IMPLANT
TROCAR XCEL 12X100 BLDLESS (ENDOMECHANICALS) ×3 IMPLANT

## 2015-03-30 NOTE — Anesthesia Preprocedure Evaluation (Signed)
Anesthesia Evaluation  Patient identified by MRN, date of birth, ID band Patient awake    Reviewed: Allergy & Precautions, NPO status , Patient's Chart, lab work & pertinent test results  Airway Mallampati: I  TM Distance: >3 FB Neck ROM: Full    Dental   Pulmonary Current Smoker,    Pulmonary exam normal        Cardiovascular Normal cardiovascular exam     Neuro/Psych    GI/Hepatic   Endo/Other    Renal/GU      Musculoskeletal   Abdominal   Peds  Hematology   Anesthesia Other Findings   Reproductive/Obstetrics                             Anesthesia Physical Anesthesia Plan  ASA: II  Anesthesia Plan: General   Post-op Pain Management:    Induction: Intravenous  Airway Management Planned: Oral ETT  Additional Equipment:   Intra-op Plan:   Post-operative Plan: Extubation in OR  Informed Consent: I have reviewed the patients History and Physical, chart, labs and discussed the procedure including the risks, benefits and alternatives for the proposed anesthesia with the patient or authorized representative who has indicated his/her understanding and acceptance.     Plan Discussed with: CRNA and Surgeon  Anesthesia Plan Comments:         Anesthesia Quick Evaluation  

## 2015-03-30 NOTE — Anesthesia Postprocedure Evaluation (Signed)
Anesthesia Post Note  Patient: Ann Reyes  Procedure(s) Performed: Procedure(s) (LRB): APPENDECTOMY LAPAROSCOPIC (N/A)  Patient location during evaluation: PACU Anesthesia Type: General Level of consciousness: awake and alert Pain management: pain level controlled Vital Signs Assessment: post-procedure vital signs reviewed and stable Respiratory status: spontaneous breathing, nonlabored ventilation, respiratory function stable and patient connected to nasal cannula oxygen Cardiovascular status: blood pressure returned to baseline and stable Postop Assessment: no signs of nausea or vomiting Anesthetic complications: no    Last Vitals:  Filed Vitals:   03/30/15 1600 03/30/15 1800  BP: 105/66 116/85  Pulse: 75 79  Temp: 36.7 C 36.9 C  Resp: 18 18    Last Pain:  Filed Vitals:   03/30/15 1813  PainSc: Asleep                 Lirio Bach DAVID

## 2015-03-30 NOTE — Transfer of Care (Signed)
Immediate Anesthesia Transfer of Care Note  Patient: Ann PennerBetty Eskelson  Procedure(s) Performed: Procedure(s): APPENDECTOMY LAPAROSCOPIC (N/A)  Patient Location: PACU  Anesthesia Type:General  Level of Consciousness: awake, alert  and oriented  Airway & Oxygen Therapy: Patient Spontanous Breathing and Patient connected to face mask oxygen  Post-op Assessment: Report given to RN and Post -op Vital signs reviewed and stable  Post vital signs: Reviewed and stable  Last Vitals:  Filed Vitals:   03/29/15 2141 03/30/15 0626  BP: 89/64 110/72  Pulse:  90  Temp: 37.1 C 37 C  Resp: 18 17    Complications: No apparent anesthesia complications

## 2015-03-30 NOTE — Op Note (Signed)
12:00 PM  PATIENT:  Ann Reyes  54 y.o. female  Patient Care Team: Marcelo BaldyJessica S Mauney, PA-C as PCP - General (Physician Assistant)  PRE-OPERATIVE DIAGNOSIS:  acute appendicitis  POST-OPERATIVE DIAGNOSIS:  perforated appendicitis  PROCEDURE:  Procedure(s): APPENDECTOMY LAPAROSCOPIC  SURGEON:  Surgeon(s): Karie SodaSteven Shulem Mader, MD  ANESTHESIA:   local and general  EBL:  Total I/O In: 1000 [I.V.:1000] Out: 100 [Urine:50; Blood:50]  Delay start of Pharmacological VTE agent (>24hrs) due to surgical blood loss or risk of bleeding:  no  DRAINS: none   SPECIMEN:  Source of Specimen:   APPENDIX  DISPOSITION OF SPECIMEN:  PATHOLOGY  COUNTS:  YES  PLAN OF CARE: Admit for overnight observation  PATIENT DISPOSITION:  PACU - hemodynamically stable.   INDICATIONS: Patient with concerning symptoms & work up suspicious for appendicitis.  Surgery was recommended:  The anatomy & physiology of the digestive tract was discussed.  The pathophysiology of appendicitis was discussed.  Natural history risks without surgery was discussed.   I feel the risks of no intervention will lead to serious problems that outweigh the operative risks; therefore, I recommended diagnostic laparoscopy with removal of appendix to remove the pathology.  Laparoscopic & open techniques were discussed.   I noted a good likelihood this will help address the problem.    Risks such as bleeding, infection, abscess, leak, reoperation, possible ostomy, hernia, heart attack, death, and other risks were discussed.  Goals of post-operative recovery were discussed as well.  We will work to minimize complications.  Questions were answered.  The patient expresses understanding & wishes to proceed with surgery.  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  No abscess.  DESCRIPTION:   The patient was identified & brought into the operating room. The patient was positioned supine with arms tucked. SCDs were  active during the entire case. The patient underwent general anesthesia without any difficulty.  The abdomen was prepped and draped in a sterile fashion. A Surgical Timeout confirmed our plan.   I made a transverse incision through the inferior umbilical fold at her prior lap incision.  I made a small transverse nick through the infraumbilical fascia and confirmed peritoneal entry.  I placed a 5mm port.  We induced carbon dioxide insufflation.  Camera inspection revealed no injury.  I placed additional ports under direct laparoscopic visualization.  I mobilized the terminal ileum to proximal ascending colon in a lateral to medial fashion.  I took care to avoid injuring any retroperitoneal structures.  I freed the appendix off its attachments to the ascending colon and cecal mesentery.  It was very stuck with necrosis & phlegmon c/w perforation.  No discrete abscess found.  I elevated the appendix. I skeletonized the mesoappendix.   I was able to free off the base of the appendix which was still viable.  I stapled the appendix off the cecum using a laparoscopic stapler. I took a healthy cuff of viable cecum. I transected the mesoappendix and assured hemostasis in the mesentery.  I placed the appendix inside an EndoCatch bag and removed out the 12 mm port.  I did copious irrigation. Hemostasis was good in the mesoappendix, colon mesentery, and retroperitoneum. Staple line was intact on the cecum with no bleeding. I washed out the pelvis, retrohepatic space and right paracolic gutter. I washed out the left side as well.  Hemostasis is good. There was no perforation or injury.  Because the area cleaned up well after irrigation, I did not place a drain.  I aspirated the carbon dioxide. I removed the ports. I closed the 12 mm fascia site using a #1 PDS suture with a laparoscopic suture passer. I closed skin using 4-0 monocryl stitch.  Patient was extubated and sent to the recovery room.  I discussed the  operative findings with the patient's family. I suspect the patient is going used in the hospital at least overnight and will need antibiotics for 5 days. Questions answered. They expressed understanding and appreciation.  Ardeth Sportsman, M.D., F.A.C.S. Gastrointestinal and Minimally Invasive Surgery [facility] [address], [address] [place], [place] [address] [phone] Main / Paging

## 2015-03-30 NOTE — Anesthesia Procedure Notes (Signed)
Procedure Name: Intubation Date/Time: 03/30/2015 10:45 AM Performed by: Enriqueta ShutterWILLIFORD, Imane Burrough D Pre-anesthesia Checklist: Patient identified, Emergency Drugs available, Suction available and Patient being monitored Patient Re-evaluated:Patient Re-evaluated prior to inductionOxygen Delivery Method: Circle System Utilized Preoxygenation: Pre-oxygenation with 100% oxygen Intubation Type: IV induction, Rapid sequence and Cricoid Pressure applied Laryngoscope Size: Mac and 3 Grade View: Grade II Tube type: Oral Tube size: 7.5 mm Number of attempts: 1 Airway Equipment and Method: Stylet and Oral airway Placement Confirmation: ETT inserted through vocal cords under direct vision,  positive ETCO2 and breath sounds checked- equal and bilateral Secured at: 21 cm Tube secured with: Tape Dental Injury: Teeth and Oropharynx as per pre-operative assessment

## 2015-03-31 NOTE — Progress Notes (Signed)
Nurse reviewed discharge instructions with pt.  Pt verbalized understanding of discharge instructions, follow up appointment and new medication.  Prescription given to pt prior to discharge. No concerns at time of discharge. 

## 2015-03-31 NOTE — Discharge Instructions (Signed)
Increase activity as tolerated to regular activity.  Low impact exercise such as walking an hour a day at least ideal.    Do not push through pain.  Diet as tolerated.  Low fat high fiber diet ideal.   Bowel regimen with 30 g fiber a day and fiber supplement as needed to avoid problems.  Return to clinic ~3 weeks to make sure that you are recovering  Instructions discussed.  Followup with primary care physician for other health issues as would normally be done.    Consider screening for malignancies (breast, prostate, colon, melanoma, etc) as appropriate.  Questions answered.  The patient expressed understanding and appreciation    Appendicitis Appendicitis is when the appendix is swollen (inflamed). The inflammation can lead to developing a hole (perforation) and a collection of pus (abscess). CAUSES  There is not always an obvious cause of appendicitis. Sometimes it is caused by an obstruction in the appendix. The obstruction can be caused by:  A small, hard, pea-sized ball of stool (fecalith).  Enlarged lymph glands in the appendix. SYMPTOMS   Pain around your belly button (navel) that moves toward your lower right belly (abdomen). The pain can become more severe and sharp as time passes.  Tenderness in the lower right abdomen. Pain gets worse if you cough or make a sudden movement.  Feeling sick to your stomach (nauseous).  Throwing up (vomiting).  Loss of appetite.  Fever.  Constipation.  Diarrhea.  Generally not feeling well. DIAGNOSIS   Physical exam.  Blood tests.  Urine test.  X-rays or a CT scan may confirm the diagnosis. TREATMENT  Once the diagnosis of appendicitis is made, the most common treatment is to remove the appendix as soon as possible. This procedure is called appendectomy. In an open appendectomy, a cut (incision) is made in the lower right abdomen and the appendix is removed. In a laparoscopic appendectomy, usually 3 small incisions are  made. Long, thin instruments and a camera tube are used to remove the appendix. Most patients go home in 24 to 48 hours after appendectomy. In some situations, the appendix may have already perforated and an abscess may have formed. The abscess may have a "wall" around it as seen on a CT scan. In this case, a drain may be placed into the abscess to remove fluid, and you may be treated with antibiotic medicines that kill germs. The medicine is given through a tube in your vein (IV). Once the abscess has resolved, it may or may not be necessary to have an appendectomy. You may need to stay in the hospital longer than 48 hours.   This information is not intended to replace advice given to you by your health care provider. Make sure you discuss any questions you have with your health care provider.   Document Released: 04/06/2005 Document Revised: 10/06/2011 Document Reviewed: 08/22/2014 Elsevier Interactive Patient Education 2016 [employer]  LAPAROSCOPIC SURGERY: POST OP INSTRUCTIONS  1. DIET: Follow a light bland diet the first 24 hours after arrival home, such as soup, liquids, crackers, etc.  Be sure to include lots of fluids daily.  Avoid fast food or heavy meals as your are more likely to get nauseated.  Eat a low fat the next few days after surgery.   2. Take your usually prescribed home medications unless otherwise directed. 3. PAIN CONTROL: a. Pain is best controlled by a usual combination of three different methods TOGETHER: i. Ice/Heat ii. Over the counter pain medication iii. Prescription  pain medication b. Most patients will experience some swelling and bruising around the incisions.  Ice packs or heating pads (30-60 minutes up to 6 times a day) will help. Use ice for the first few days to help decrease swelling and bruising, then switch to heat to help relax tight/sore spots and speed recovery.  Some people prefer to use ice alone, heat alone, alternating between ice & heat.   Experiment to what works for you.  Swelling and bruising can take several weeks to resolve.   c. It is helpful to take an over-the-counter pain medication regularly for the first few weeks.  Choose one of the following that works best for you: i. Naproxen (Aleve, etc)  Two 220mg  tabs twice a day ii. Ibuprofen (Advil, etc) Three 200mg  tabs four times a day (every meal & bedtime) iii. Acetaminophen (Tylenol, etc) 500-650mg  four times a day (every meal & bedtime) d. A  prescription for pain medication (such as oxycodone, hydrocodone, etc) should be given to you upon discharge.  Take your pain medication as prescribed.  i. If you are having problems/concerns with the prescription medicine (does not control pain, nausea, vomiting, rash, itching, etc), please call us [phone] to see if we need to switch you to a different pain medicine that will work better for you and/or control your side effect better. ii. If you need a refill on your pain medication, please contact your pharmacy.  They will contact our office to request authorization. Prescriptions will not be filled after 5 pm or on week-ends. 4. Avoid getting constipated.  Between the surgery and the pain medications, it is common to experience some constipation.  Increasing fluid intake and taking a fiber supplement (such as Metamucil, Citrucel, FiberCon, MiraLax, etc) 1-2 times a day regularly will usually help prevent this problem from occurring.  A mild laxative (prune juice, Milk of Magnesia, MiraLax, etc) should be taken according to package directions if there are no bowel movements after 48 hours.   5. Watch out for diarrhea.  If you have many loose bowel movements, simplify your diet to bland foods & liquids for a few days.  Stop any stool softeners and decrease your fiber supplement.  Switching to mild anti-diarrheal medications (Kayopectate, Pepto Bismol) can help.  If this worsens or does not improve, please call us. 6. Wash / shower every  day.  You may shower over the dressings as they are waterproof.  Continue to shower over incision(s) after the dressing is off. 7. Remove your waterproof bandages 5 days after surgery.  You may leave the incision open to air.  You may replace a dressing/Band-Aid to cover the incision for comfort if you wish.  8. ACTIVITIES as tolerated:   a. You may resume regular (light) daily activities beginning the next day--such as daily self-care, walking, climbing stairs--gradually increasing activities as tolerated.  If you can walk 30 minutes without difficulty, it is safe to try more intense activity such as jogging, treadmill, bicycling, low-impact aerobics, swimming, etc. b. Save the most intensive and strenuous activity for last such as sit-ups, heavy lifting, contact sports, etc  Refrain from any heavy lifting or straining until you are off narcotics for pain control.   c. DO NOT PUSH THROUGH PAIN.  Let pain be your guide: If it hurts to do something, don't do it.  Pain is your body warning you to avoid that activity for another week until the pain goes down. d. You may drive when you are  no longer taking prescription pain medication, you can comfortably wear a seatbelt, and you can safely maneuver your car and apply brakes. e. Bonita Quin may have sexual intercourse when it is comfortable.  9. FOLLOW UP in our office a. Please call [facility] at [phone] to set up an appointment to see your surgeon in the office for a follow-up appointment approximately 2-3 weeks after your surgery. b. Make sure that you call for this appointment the day you arrive home to insure a convenient appointment time. 10. IF YOU HAVE DISABILITY OR FAMILY LEAVE FORMS, BRING THEM TO THE OFFICE FOR PROCESSING.  DO NOT GIVE THEM TO YOUR DOCTOR.   WHEN TO CALL us [phone]: 1. Poor pain control 2. Reactions / problems with new medications (rash/itching, nausea, etc)  3. Fever over 101.5 F (38.5 C) 4. Inability to urinate 5. Nausea  and/or vomiting 6. Worsening swelling or bruising 7. Continued bleeding from incision. 8. Increased pain, redness, or drainage from the incision   The clinic staff is available to answer your questions during regular business hours (8:30am-5pm).  Please dont hesitate to call and ask to speak to one of our nurses for clinical concerns.   If you have a medical emergency, go to the nearest emergency room or call 911.  A surgeon from [facility] is always on call at the [facility], [place] [address], [address], [place], [place]  [address] ? MAIN: [phone] ? TOLL FREE: [phone] ?  FAX [phone] [email or web address]  Managing Pain  Pain after surgery or related to activity is often due to strain/injury to muscle, tendon, nerves and/or incisions.  This pain is usually short-term and will improve in a few months.   Many people find it helpful to do the following things TOGETHER to help speed the process of healing and to get back to regular activity more quickly:  1. Avoid heavy physical activity at first a. No lifting greater than 20 pounds at first, then increase to lifting as tolerated over the next few weeks b. Do not push through the pain.  Listen to your body and avoid positions and maneuvers than reproduce the pain.  Wait a few days before trying something more intense c. Walking is okay as tolerated, but go slowly and stop when getting sore.  If you can walk 30 minutes without stopping or pain, you can try more intense activity (running, jogging, aerobics, cycling, swimming, treadmill, sex, sports, weightlifting, etc ) d. Remember: If it hurts to do it, then dont do it!  2. Take Anti-inflammatory medication a. Choose ONE of the following over-the-counter medications: i.            Acetaminophen 500mg  tabs (Tylenol) 1-2 pills with every meal and just before bedtime (avoid if you have liver problems) ii.             Naproxen 220mg  tabs (ex. Aleve) 1-2 pills twice a day (avoid if you have kidney, stomach, IBD, or bleeding problems) iii. Ibuprofen 200mg  tabs (ex. Advil, Motrin) 3-4 pills with every meal and just before bedtime (avoid if you have kidney, stomach, IBD, or bleeding problems) b. Take with food/snack around the clock for 1-2 weeks i. This helps the muscle and nerve tissues become less irritable and calm down faster  3. Use a Heating pad or Ice/Cold Pack a. 4-6 times a day b. May use warm bath/hottub  or showers  4. Try Gentle Massage and/or Stretching  a. at the area of pain many times a day b. stop if you feel pain - do not overdo it  Try these steps together to help you body heal faster and avoid making things get worse.  Doing just one of these things may not be enough.    If you are not getting better after two weeks or are noticing you are getting worse, contact our office for further advice; we may need to re-evaluate you & see what other things we can do to help.  GETTING TO GOOD BOWEL HEALTH. Irregular bowel habits such as constipation and diarrhea can lead to many problems over time.  Having one soft bowel movement a day is the most important way to prevent further problems.  The anorectal canal is designed to handle stretching and feces to safely manage our ability to get rid of solid waste (feces, poop, stool) out of our body.  BUT, hard constipated stools can act like ripping concrete bricks and diarrhea can be a burning fire to this very sensitive area of our body, causing inflamed hemorrhoids, anal fissures, increasing risk is perirectal abscesses, abdominal pain/bloating, an making irritable bowel worse.      The goal: ONE SOFT BOWEL MOVEMENT A DAY!  To have soft, regular bowel movements:   Drink plenty of fluids, consider 4-6 tall glasses of water a day.    Take plenty of fiber.  Fiber is the undigested part of plant food that passes into the colon, acting s natures broom to  encourage bowel motility and movement.  Fiber can absorb and hold large amounts of water. This results in a larger, bulkier stool, which is soft and easier to pass. Work gradually over several weeks up to 6 servings a day of fiber (25g a day even more if needed) in the form of: o Vegetables -- Root (potatoes, carrots, turnips), leafy green (lettuce, salad greens, celery, spinach), or cooked high residue (cabbage, broccoli, etc) o Fruit -- Fresh (unpeeled skin & pulp), Dried (prunes, apricots, cherries, etc ),  or stewed ( applesauce)  o Whole grain breads, pasta, etc (whole wheat)  o Bran cereals   Bulking Agents -- This type of water-retaining fiber generally is easily obtained each day by one of the following:  o Psyllium bran -- The psyllium plant is remarkable because its ground seeds can retain so much water. This product is available as Metamucil, Konsyl, Effersyllium, Per Diem Fiber, or the less expensive generic preparation in drug and health food stores. Although labeled a laxative, it really is not a laxative.  o Methylcellulose -- This is another fiber derived from wood which also retains water. It is available as Citrucel. o Polyethylene Glycol - and artificial fiber commonly called Miralax or Glycolax.  It is helpful for people with gassy or bloated feelings with regular fiber o Flax Seed - a less gassy fiber than psyllium  No reading or other relaxing activity while on the toilet. If bowel movements take longer than 5 minutes, you are too constipated  AVOID CONSTIPATION.  High fiber and water intake usually takes care of this.  Sometimes a laxative is needed to stimulate more frequent bowel movements, but   Laxatives are not a good long-term solution as it can wear the colon out.  They can help jump-start bowels if constipated, but should be relied on constantly without discussing with your doctor o Osmotics (Milk of Magnesia, Fleets phosphosoda, Magnesium citrate, MiraLax, GoLytely)  are safer than  o Stimulants (Senokot,  Castor Oil, Dulcolax, Ex Lax)    o Avoid taking laxatives for more than 7 days in a row.   IF SEVERELY CONSTIPATED, try a Bowel Retraining Program: o Do not use laxatives.  o Eat a diet high in roughage, such as bran cereals and leafy vegetables.  o Drink six (6) ounces of prune or apricot juice each morning.  o Eat two (2) large servings of stewed fruit each day.  o Take one (1) heaping tablespoon of a psyllium-based bulking agent twice a day. Use sugar-free sweetener when possible to avoid excessive calories.  o Eat a normal breakfast.  o Set aside 15 minutes after breakfast to sit on the toilet, but do not strain to have a bowel movement.  o If you do not have a bowel movement by the third day, use an enema and repeat the above steps.   Controlling diarrhea o Switch to liquids and simpler foods for a few days to avoid stressing your intestines further. o Avoid dairy products (especially milk & ice cream) for a short time.  The intestines often can lose the ability to digest lactose when stressed. o Avoid foods that cause gassiness or bloating.  Typical foods include beans and other legumes, cabbage, broccoli, and dairy foods.  Every person has some sensitivity to other foods, so listen to our body and avoid those foods that trigger problems for you. o Adding fiber (Citrucel, Metamucil, psyllium, Miralax) gradually can help thicken stools by absorbing excess fluid and retrain the intestines to act more normally.  Slowly increase the dose over a few weeks.  Too much fiber too soon can backfire and cause cramping & bloating. o Probiotics (such as active yogurt, Align, etc) may help repopulate the intestines and colon with normal bacteria and calm down a sensitive digestive tract.  Most studies show it to be of mild help, though, and such products can be costly. o Medicines: - Bismuth subsalicylate (ex. Kayopectate, Pepto Bismol) every 30 minutes for up to 6  doses can help control diarrhea.  Avoid if pregnant. - Loperamide (Immodium) can slow down diarrhea.  Start with two tablets (  total) first and then try one tablet every 6 hours.  Avoid if you are having fevers or severe pain.  If you are not better or start feeling worse, stop all medicines and call your doctor for advice o Call your doctor if you are getting worse or not better.  Sometimes further testing (cultures, endoscopy, X-ray studies, bloodwork, etc) may be needed to help diagnose and treat the cause of the diarrhea.  TROUBLESHOOTING IRREGULAR BOWELS 1) Avoid extremes of bowel movements (no bad constipation/diarrhea) 2) Miralax 17gm mixed in 8oz. water or juice-daily. May use BID as needed.  3) Gas-x,Phazyme, etc. as needed for gas & bloating.  4) Soft,bland diet. No spicy,greasy,fried foods.  5) Prilosec over-the-counter as needed  6) May hold gluten/wheat products from diet to see if symptoms improve.  7)  May try probiotics (Align, Activa, etc) to help calm the bowels down 7) If symptoms become worse call back immediately.

## 2015-03-31 NOTE — Discharge Summary (Signed)
Physician Discharge Summary  Patient ID: Ann Reyes MRN: [id number] DOB/AGE: 1960/06/26 54 y.o.  Admit date: 03/29/2015 Discharge date: 03/31/2015  Patient Care Team: Michel Harrow, PA-C as PCP - General (Physician Assistant)  Admission Diagnoses: Principal Problem:   Acute appendicitis Active Problems:   Cholelithiasis   Ovarian cyst, left   Acute perforated appendicitis   Discharge Diagnoses:  Principal Problem:   Acute appendicitis Active Problems:   Cholelithiasis   Ovarian cyst, left   Acute perforated appendicitis   POST-OPERATIVE DIAGNOSIS:   perforated appendicitis  SURGERY:  03/29/2015 - 03/30/2015  Procedure(s): APPENDECTOMY LAPAROSCOPIC  SURGEON:    Surgeon(s): Michael Boston, MD  Consults: None  Hospital Course:   The patient underwent the surgery above.  Postoperatively, the patient gradually mobilized and advanced to a solid diet.  Pain and other symptoms were treated aggressively.    By the time of discharge, the patient was walking well the hallways, eating food, having flatus.  Pain was well-controlled on an oral medications.  Based on meeting discharge criteria and continuing to recover, I felt it was safe for the patient to be discharged from the hospital to further recover with close followup. Augmentin Abx x 5 days given perforation.  Set up with PCP for health maintenance.  Postoperative recommendations were discussed in detail.  They are written as well.   Significant Diagnostic Studies:  Results for orders placed or performed during the hospital encounter of 03/29/15 (from the past 72 hour(s))  Lipase, blood     Status: None   Collection Time: 03/29/15  5:00 PM  Result Value Ref Range   Lipase 24 11 - 51 U/L  Comprehensive metabolic panel     Status: Abnormal   Collection Time: 03/29/15  5:00 PM  Result Value Ref Range   Sodium 136 135 - 145 mmol/L   Potassium 3.4 (L) 3.5 - 5.1 mmol/L   Chloride 98 (L) 101 - 111 mmol/L   CO2 29  22 - 32 mmol/L   Glucose, Bld 107 (H) 65 - 99 mg/dL   BUN 13 6 - 20 mg/dL   Creatinine, Ser 0.73 0.44 - 1.00 mg/dL   Calcium 9.8 8.9 - 10.3 mg/dL   Total Protein 8.3 (H) 6.5 - 8.1 g/dL   Albumin 4.2 3.5 - 5.0 g/dL   AST 23 15 - 41 U/L   ALT 27 14 - 54 U/L   Alkaline Phosphatase 72 38 - 126 U/L   Total Bilirubin 1.1 0.3 - 1.2 mg/dL   GFR calc non Af Amer >60 >60 mL/min   GFR calc Af Amer >60 >60 mL/min    Comment: (NOTE) The eGFR has been calculated using the CKD EPI equation. This calculation has not been validated in all clinical situations. eGFR's persistently <60 mL/min signify possible Chronic Kidney Disease.    Anion gap 9 5 - 15  CBC     Status: Abnormal   Collection Time: 03/29/15  5:00 PM  Result Value Ref Range   WBC 18.1 (H) 4.0 - 10.5 K/uL   RBC 4.93 3.87 - 5.11 MIL/uL   Hemoglobin 15.2 (H) 12.0 - 15.0 g/dL   HCT 43.4 36.0 - 46.0 %   MCV 88.0 78.0 - 100.0 fL   MCH 30.8 26.0 - 34.0 pg   MCHC 35.0 30.0 - 36.0 g/dL   RDW 13.4 11.5 - 15.5 %   Platelets 204 150 - 400 K/uL  Urinalysis, Routine w reflex microscopic (not at [facility])     Status:  Abnormal   Collection Time: 03/29/15  7:42 PM  Result Value Ref Range   Color, Urine YELLOW YELLOW   APPearance CLEAR CLEAR   Specific Gravity, Urine 1.018 1.005 - 1.030   pH 6.0 5.0 - 8.0   Glucose, UA NEGATIVE NEGATIVE mg/dL   Hgb urine dipstick MODERATE (A) NEGATIVE   Bilirubin Urine NEGATIVE NEGATIVE   Ketones, ur NEGATIVE NEGATIVE mg/dL   Protein, ur NEGATIVE NEGATIVE mg/dL   Nitrite NEGATIVE NEGATIVE   Leukocytes, UA SMALL (A) NEGATIVE  Urine microscopic-add on     Status: Abnormal   Collection Time: 03/29/15  7:42 PM  Result Value Ref Range   Squamous Epithelial / LPF 6-30 (A) NONE SEEN   WBC, UA 6-30 0 - 5 WBC/hpf   RBC / HPF 0-5 0 - 5 RBC/hpf   Bacteria, UA FEW (A) NONE SEEN  MRSA PCR Screening     Status: None   Collection Time: 03/30/15  5:20 AM  Result Value Ref Range   MRSA by PCR NEGATIVE NEGATIVE     Comment:        The GeneXpert MRSA Assay (FDA approved for NASAL specimens only), is one component of a comprehensive MRSA colonization surveillance program. It is not intended to diagnose MRSA infection nor to guide or monitor treatment for MRSA infections.     Ct Abdomen Pelvis W Contrast  03/29/2015  CLINICAL DATA:  Central abdominal pain shifting to the right lower quadrant. Elevated white blood cell count. Concern for appendicitis. EXAM: CT ABDOMEN AND PELVIS WITH CONTRAST TECHNIQUE: Multidetector CT imaging of the abdomen and pelvis was performed using the standard protocol following bolus administration of intravenous contrast. CONTRAST:  12m OMNIPAQUE IOHEXOL 300 MG/ML  SOLN COMPARISON:  None. FINDINGS: Lower chest:  The included lung bases are clear. Liver: Mild steatosis. Subcapsular 1.7 cm peripherally enhancing lesion in the right lobe consistent with hemangioma. 81.4 cm hypodense lesion adjacent the gallbladder fossa with peripheral nodular enhancement, also likely hemangioma. Smaller subcentimeter hypodensities scattered throughout the liver parenchyma, nonspecific. Hepatobiliary: Gallbladder physiologically distended. At least 2 lamellated gallstones within the gallbladder fossa. No pericholecystic inflammatory change. No biliary dilatation. Pancreas: Normal. Spleen: Normal. Adrenal glands: No nodule. Kidneys: Symmetric renal enhancement. No hydronephrosis. Mild prominence of the right ureter, likely reactive. Tiny subcapsular hypodensity in the interpolar right kidney, too small to characterize. Stomach/Bowel: Stomach physiologically distended. There are no dilated or thickened small bowel loops. Small volume of stool throughout the colon without colonic wall thickening. Scattered diverticulosis throughout the distal colon without diverticulitis. Appendix: Distended measuring up to 12 mm with questionable appendicolith at the base. There is moderate surrounding periappendiceal fat  stranding and small amount of free fluid. Small foci of air within the appendiceal lumen without frank perforation or extraluminal air. No abscess. Vascular/Lymphatic: No retroperitoneal adenopathy. Abdominal aorta is normal in caliber. Mild atherosclerosis without aneurysm. Reproductive: Uterus is normal in size. Left ovarian enlargement measuring 4.3 x 3.2 cm with septated cyst versus adjacent cysts. Right ovary normal in size. Bladder: Physiologically distended without wall thickening. Other: No free air or intra-abdominal fluid collection. Tiny fat containing umbilical hernia. Musculoskeletal: There are no acute or suspicious osseous abnormalities. Facet arthropathy in the lumbar spine. IMPRESSION: 1. Acute appendicitis.  No abscess or perforation. 2. Left ovarian enlargement with septated cyst versus adjacent cysts. Recommend pelvic ultrasound for further characterization after acute illness has resolved. 3. Incidental findings include cholelithiasis, no findings of acute cholecystitis. Diverticulosis. Hepatic hemangiomas. Electronically Signed   By: MThreasa Beards  Ehinger M.D.   On: 03/29/2015 19:38    Discharge Exam: Blood pressure 103/62, pulse 60, temperature 98.4 F (36.9 C), temperature source Oral, resp. rate 16, height 5' 2"  (1.575 m), weight 74.844 kg (165 lb), SpO2 96 %.  General: Pt awake/alert/oriented x4 in no major acute distress.  Smiling, walking around in room Eyes: PERRL, normal EOM. Sclera nonicteric Neuro: CN II-XII intact w/o focal sensory/motor deficits. Lymph: No head/neck/groin lymphadenopathy Psych:  No delerium/psychosis/paranoia HENT: Normocephalic, Mucus membranes moist.  No thrush Neck: Supple, No tracheal deviation Chest: No pain.  Good respiratory excursion. CV:  Pulses intact.  Regular rhythm MS: Normal AROM mjr joints.  No obvious deformity Abdomen: Soft, Nondistended.  Min tender LLQ portsite.  No incarcerated hernias. Ext:  SCDs BLE.  No significant edema.  No  cyanosis Skin: No petechiae / purpura  Discharged Condition: Good   Past Medical History  Diagnosis Date  . Knee pain     Past Surgical History  Procedure Laterality Date  . Laparoscopic tubal ligation      Social History   Social History  . Marital Status: Married    Spouse Name: N/A  . Number of Children: N/A  . Years of Education: N/A   Occupational History  . Not on file.   Social History Main Topics  . Smoking status: Current Every Day Smoker -- 1.00 packs/day for 20 years    Types: Cigarettes  . Smokeless tobacco: Never Used  . Alcohol Use: No  . Drug Use: No  . Sexual Activity: Not on file   Other Topics Concern  . Not on file   Social History Narrative   Originally from [place] but lived in [place] most of her life   In [place] since 2006.    History reviewed. No pertinent family history.  Current Facility-Administered Medications  Medication Dose Route Frequency Provider Last Rate Last Dose  . acetaminophen (TYLENOL) suppository 650 mg  650 mg Rectal Q6H PRN Michael Boston, MD      . acetaminophen (TYLENOL) tablet 1,000 mg  1,000 mg Oral TID Michael Boston, MD   1,000 mg at 03/30/15 2127  . acetaminophen (TYLENOL) tablet 325-650 mg  325-650 mg Oral Q6H PRN Michael Boston, MD      . alum & mag hydroxide-simeth (MAALOX/MYLANTA) 200-200-20 MG/5ML suspension 30 mL  30 mL Oral Q6H PRN Michael Boston, MD      . cefTRIAXone (ROCEPHIN) 2 g in dextrose 5 % 50 mL IVPB  2 g Intravenous Q24H Michael Boston, MD 100 mL/hr at 03/30/15 2126 2 g at 03/30/15 2126   And  . metroNIDAZOLE (FLAGYL) IVPB 500 mg  500 mg Intravenous Q8H Michael Boston, MD   500 mg at 03/31/15 0543  . diphenhydrAMINE (BENADRYL) injection 12.5-25 mg  12.5-25 mg Intravenous Q6H PRN Michael Boston, MD      . guaiFENesin-dextromethorphan [facility] DM) 100-10 MG/5ML syrup 15 mL  15 mL Oral Q4H PRN Michael Boston, MD      . heparin injection 5,000 Units  5,000 Units Subcutaneous 3 times per day Michael Boston, MD    5,000 Units at 03/31/15 0543  . HYDROmorphone (DILAUDID) injection 0.5-2 mg  0.5-2 mg Intravenous Q1H PRN Michael Boston, MD   1 mg at 03/29/15 2205  . lactated ringers bolus 1,000 mL  1,000 mL Intravenous Q8H PRN Michael Boston, MD      . lip balm (CARMEX) ointment 1 application  1 application Topical BID Michael Boston, MD   1 application at 38/10/17  2209  . magic mouthwash  15 mL Oral QID PRN Michael Boston, MD      . menthol-cetylpyridinium (CEPACOL) lozenge 3 mg  1 lozenge Oral PRN Michael Boston, MD      . metoprolol (LOPRESSOR) injection 5 mg  5 mg Intravenous Q6H PRN Michael Boston, MD      . metoprolol tartrate (LOPRESSOR) tablet 12.5 mg  12.5 mg Oral Q12H PRN Michael Boston, MD      . naproxen (NAPROSYN) tablet 500 mg  500 mg Oral BID WC Michael Boston, MD   500 mg at 03/31/15 0756  . ondansetron (ZOFRAN) injection 4 mg  4 mg Intravenous Q6H PRN Michael Boston, MD   4 mg at 03/30/15 0636   Or  . ondansetron (ZOFRAN) 8 mg in sodium chloride 0.9 % 50 mL IVPB  8 mg Intravenous Q6H PRN Michael Boston, MD      . ondansetron (ZOFRAN-ODT) disintegrating tablet 4-8 mg  4-8 mg Oral Q6H PRN Michael Boston, MD      . oxyCODONE (Oxy IR/ROXICODONE) immediate release tablet 5-10 mg  5-10 mg Oral Q4H PRN Michael Boston, MD      . phenol (CHLORASEPTIC) mouth spray 2 spray  2 spray Mouth/Throat PRN Michael Boston, MD      . promethazine (PHENERGAN) injection 6.25-12.5 mg  6.25-12.5 mg Intravenous Q4H PRN Michael Boston, MD      . saccharomyces boulardii (FLORASTOR) capsule 250 mg  250 mg Oral BID Michael Boston, MD   250 mg at 03/30/15 2127  . zolpidem (AMBIEN) tablet 5-10 mg  5-10 mg Oral QHS PRN Michael Boston, MD         No Known Allergies  Disposition: Final discharge disposition not confirmed  Discharge Instructions    Call MD for:  extreme fatigue    Complete by:  As directed      Call MD for:  extreme fatigue    Complete by:  As directed      Call MD for:  hives    Complete by:  As directed      Call MD for:  hives     Complete by:  As directed      Call MD for:  persistant nausea and vomiting    Complete by:  As directed      Call MD for:  persistant nausea and vomiting    Complete by:  As directed      Call MD for:  redness, tenderness, or signs of infection (pain, swelling, redness, odor or green/yellow discharge around incision site)    Complete by:  As directed      Call MD for:  redness, tenderness, or signs of infection (pain, swelling, redness, odor or green/yellow discharge around incision site)    Complete by:  As directed      Call MD for:  severe uncontrolled pain    Complete by:  As directed      Call MD for:  severe uncontrolled pain    Complete by:  As directed      Call MD for:    Complete by:  As directed   Temperature > 101.41F     Call MD for:    Complete by:  As directed   Temperature > 101.41F     Diet - low sodium heart healthy    Complete by:  As directed      Discharge instructions    Complete by:  As directed   Please see discharge instruction sheets.  Also refer to handout given an office.  Please call our office if you have any questions or concerns [phone]     Discharge instructions    Complete by:  As directed   During your recent anesthetic, you were given the medication sugammadex (Bridion). This medication interacts with hormonal forms of birth control (oral contraceptives and injected or implanted birth control) and may make them ineffective. IFYOU USE ANY HORMONAL FORM OF BIRTH CONTROL, YOU MUST USE AN ADDITIONAL BARRIER BIRTH CONTROL FOR METHOD FOR SEVEN DAYS after receiving sugammadex (Bridion) or there is a chance you could become pregnant.     Discharge instructions    Complete by:  As directed   Please see discharge instruction sheets.  Also refer to handout given an office.  Please call our office if you have any questions or concerns [phone]     Discharge wound care:    Complete by:  As directed   If you have closed incisions, shower and bathe  over these incisions with soap and water every day.  Remove all surgical dressings on postoperative day #3.  You do not need to replace dressings over the closed incisions unless you feel more comfortable with a Band-Aid covering it.   If you have an open wound that requires packing, please see wound care instructions.  In general, remove all dressings, wash wound with soap and water and then replace with saline moistened gauze.  Do the dressing change at least every day.  Please call our office [phone] if you have further questions.     Discharge wound care:    Complete by:  As directed   If you have closed incisions, shower and bathe over these incisions with soap and water every day.  Remove all surgical dressings on postoperative day #3.  You do not need to replace dressings over the closed incisions unless you feel more comfortable with a Band-Aid covering it.   If you have an open wound that requires packing, please see wound care instructions.  In general, remove all dressings, wash wound with soap and water and then replace with saline moistened gauze.  Do the dressing change at least every day.  Please call our office [phone] if you have further questions.     Driving Restrictions    Complete by:  As directed   No driving until off narcotics and can safely swerve away without pain during an emergency     Driving Restrictions    Complete by:  As directed   No driving until off narcotics and can safely swerve away without pain during an emergency     Increase activity slowly    Complete by:  As directed   Walk an hour a day.  Use 20-30 minute walks.  When you can walk 30 minutes without difficulty, it is fine to restart low impact/moderate activities such as biking, jogging, swimming, sexual activity, etc.  Eventually you can increase to unrestricted activity when not feeling pain.  If you feel pain: STOP!Marland Kitchen   Let pain protect you from overdoing it.  Use ice/heat & over-the-counter  pain medications to help minimize soreness.  If that is not enough, then use your narcotic pain prescription as needed to remain active.  It is better to take extra pain medications and be more active than to stay bedridden to avoid all pain medications.     Increase activity slowly    Complete by:  As directed   Walk an hour  a day.  Use 20-30 minute walks.  When you can walk 30 minutes without difficulty, it is fine to restart low impact/moderate activities such as biking, jogging, swimming, sexual activity, etc.  Eventually you can increase to unrestricted activity when not feeling pain.  If you feel pain: STOP!Marland Kitchen   Let pain protect you from overdoing it.  Use ice/heat & over-the-counter pain medications to help minimize soreness.  If that is not enough, then use your narcotic pain prescription as needed to remain active.  It is better to take extra pain medications and be more active than to stay bedridden to avoid all pain medications.     Lifting restrictions    Complete by:  As directed   Avoid heavy lifting initially.  Do not push through pain.  You have no specific weight limit - if it hurts to do, DON'T DO IT.   If you feel no pain, you are not injuring anything.  Pain will protect you from injury.  Coughing and sneezing are far more stressful to your incision than any lifting.  Avoid resuming heavy lifting / intense activity until off all narcotic pain medications.  When ready to exercise more, give yourself 2 weeks to gradually get back to full intense exercise/activity.     Lifting restrictions    Complete by:  As directed   Avoid heavy lifting initially.  Do not push through pain.  You have no specific weight limit - if it hurts to do, DON'T DO IT.   If you feel no pain, you are not injuring anything.  Pain will protect you from injury.  Coughing and sneezing are far more stressful to your incision than any lifting.  Avoid resuming heavy lifting / intense activity until off all narcotic pain  medications.  When ready to exercise more, give yourself 2 weeks to gradually get back to full intense exercise/activity.     May shower / Bathe    Complete by:  As directed      May shower / Bathe    Complete by:  As directed      May walk up steps    Complete by:  As directed      May walk up steps    Complete by:  As directed      Sexual Activity Restrictions    Complete by:  As directed   Sexual activity as tolerated.  Do not push through pain.  Pain will protect you from injury.     Sexual Activity Restrictions    Complete by:  As directed   Sexual activity as tolerated.  Do not push through pain.  Pain will protect you from injury.     Walk with assistance    Complete by:  As directed   Walk over an hour a day.  May use a walker/cane/companion to help with balance and stamina.     Walk with assistance    Complete by:  As directed   Walk over an hour a day.  May use a walker/cane/companion to help with balance and stamina.            Medication List    TAKE these medications        amoxicillin-clavulanate 875-125 MG tablet  Commonly known as:  AUGMENTIN  Take 1 tablet by mouth 2 (two) times daily.           Follow-up Information    Follow up with [facility]. Schedule an appointment as  soon as possible for a visit in 3 weeks.   Specialty:  General Surgery   Why:  To follow up after your operation, To follow up after your hospital stay   Contact information:   [place] [phone]       Signed: Morton Peters, M.D., F.A.C.S. Gastrointestinal and Minimally Invasive Surgery [facility] [address], [place], [place] [address] [phone] Main / Paging   03/31/2015, 8:17 AM

## 2015-04-01 ENCOUNTER — Encounter (HOSPITAL_COMMUNITY): Payer: Self-pay | Admitting: Surgery

## 2017-01-03 IMAGING — CT CT ABD-PELV W/ CM
2 of 5 series · 15 of 46 positions shown, 17 images · IV contrast (OMNIPAQUE 300)
Comparison: None.

CLINICAL DATA: Central abdominal pain shifting to the right lower
quadrant. Elevated white blood cell count. Concern for appendicitis.

EXAM:
CT ABDOMEN AND PELVIS WITH CONTRAST
TECHNIQUE: Multidetector CT imaging of the abdomen and pelvis was performed
using the standard protocol following bolus administration of
intravenous contrast.
CONTRAST:  100mL OMNIPAQUE IOHEXOL 300 MG/ML  SOLN

[Series 2: abd/pel with · axial · 0.73mm/px · z∈[+926,+1336]mm · 12 of 92 slices shown, 14 images]
[im 5/92  soft-tissue]
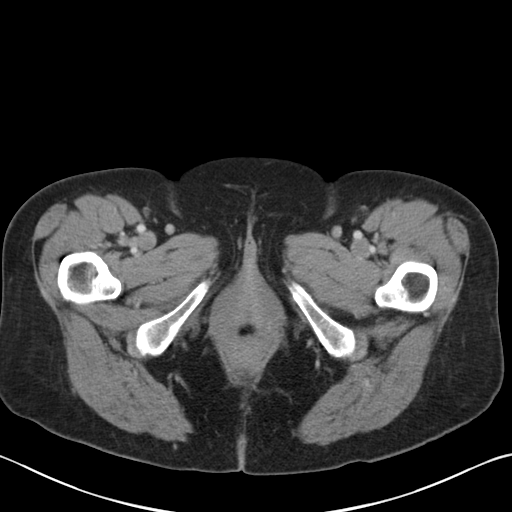
[im 5/92  bone]
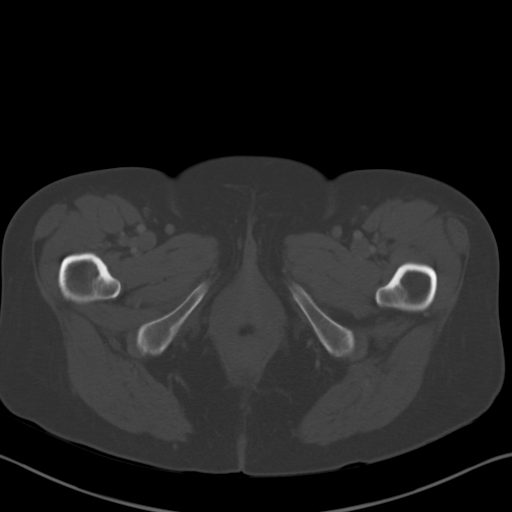
[im 15/92  soft-tissue]
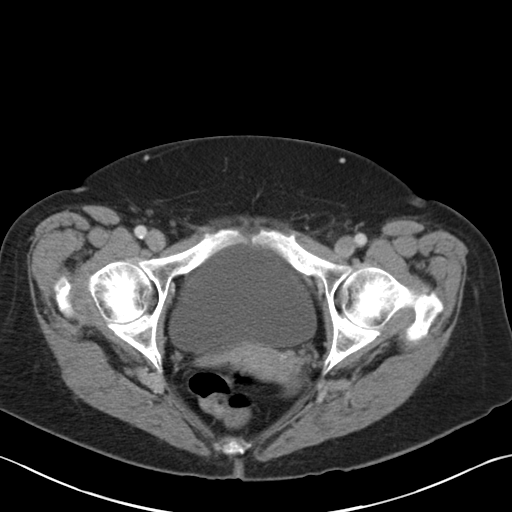
[im 20/92  soft-tissue]
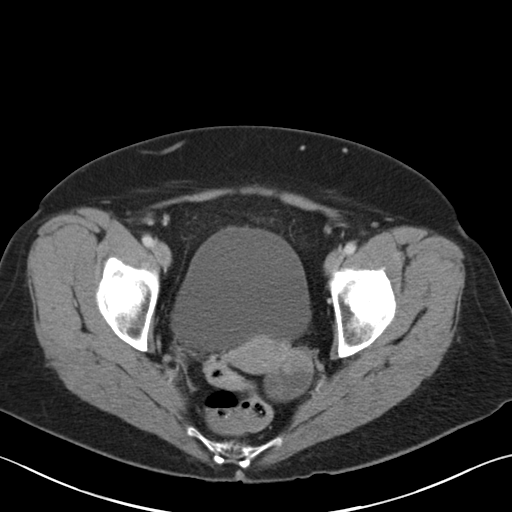
[im 29/92  soft-tissue]
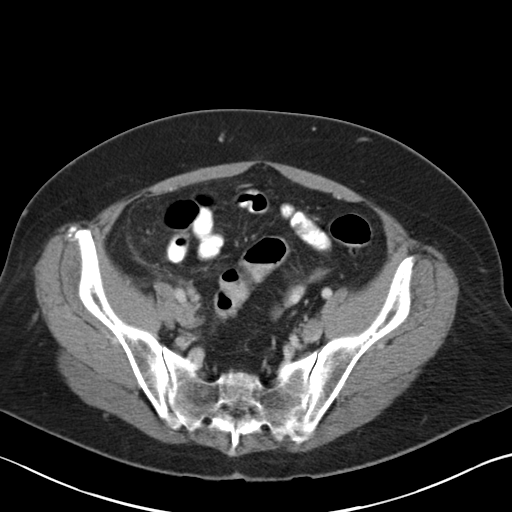
[im 34/92  soft-tissue]
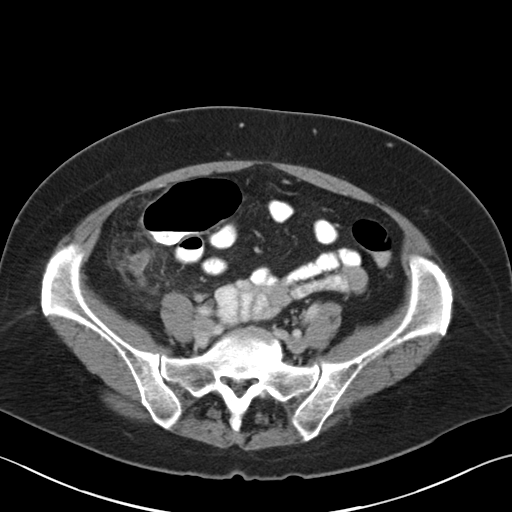
[im 44/92  soft-tissue]
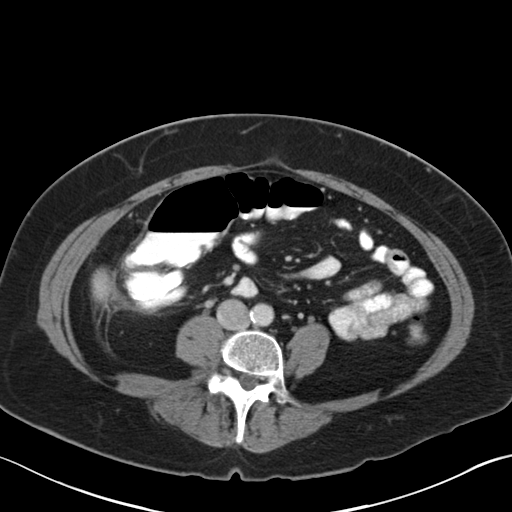
[im 48/92  soft-tissue]
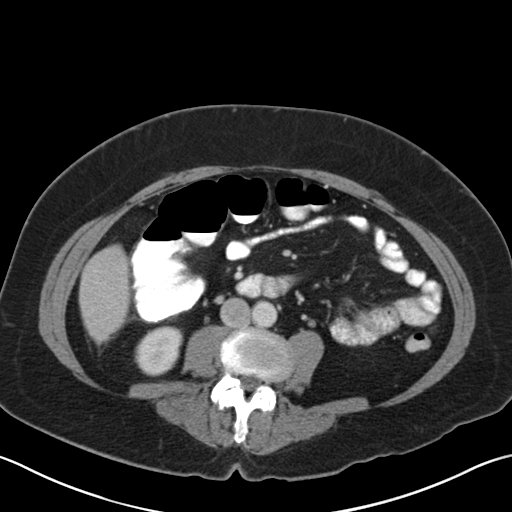
[im 58/92  soft-tissue]
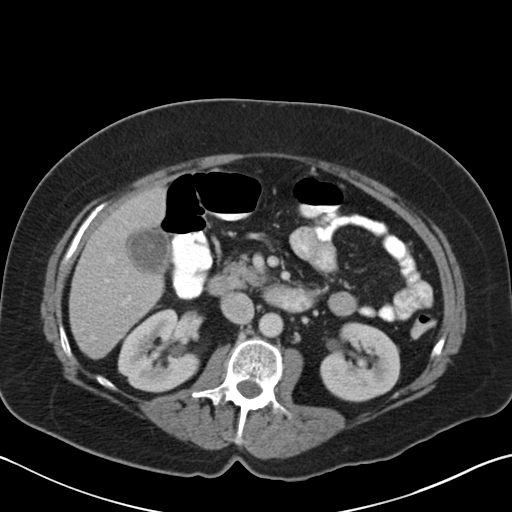
[im 63/92  soft-tissue]
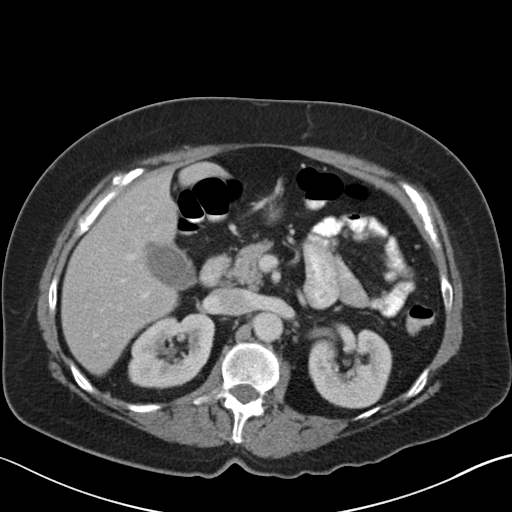
[im 63/92  bone]
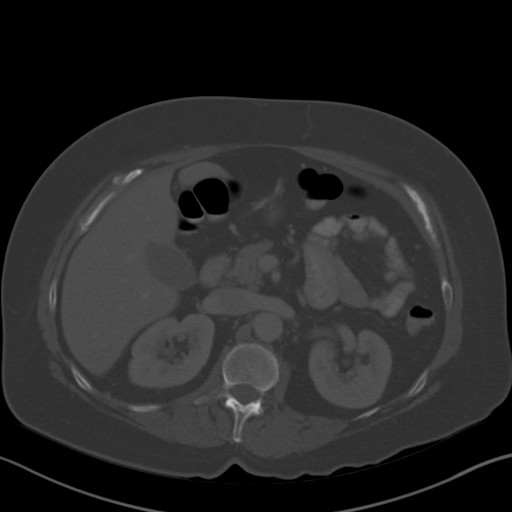
[im 72/92  soft-tissue]
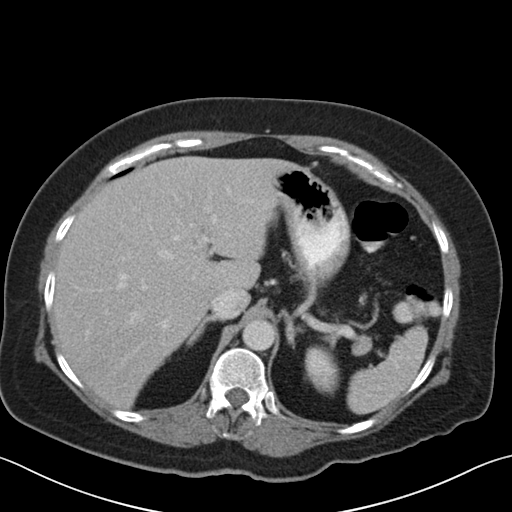
[im 77/92  soft-tissue]
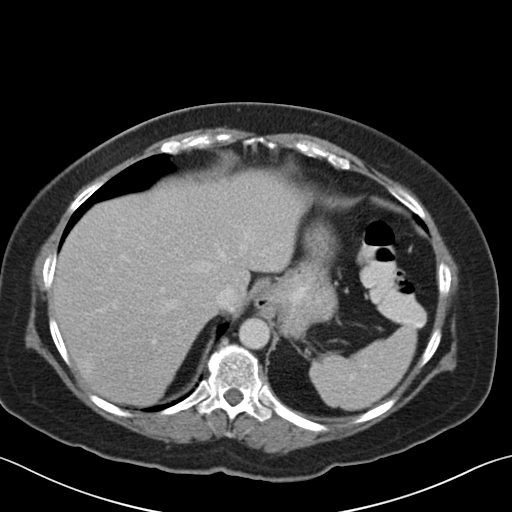
[im 87/92  soft-tissue]
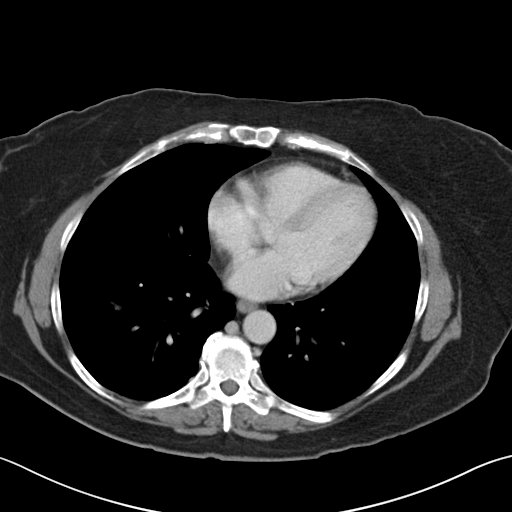

[Series 3: coronal a/|p · coronal · 0.74mm/px · 3 of 95 slices shown]
[im 32/95  soft-tissue]
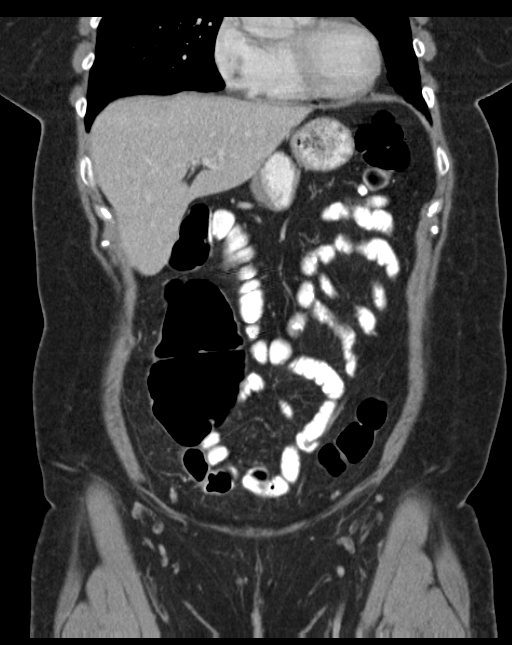
[im 42/95  soft-tissue]
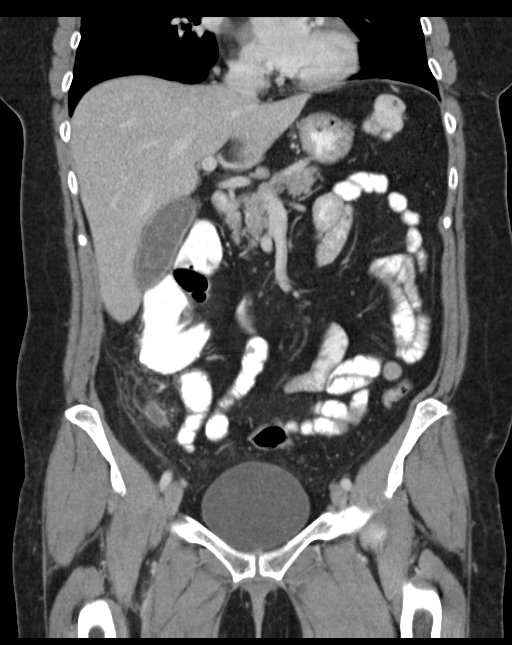
[im 53/95  soft-tissue]
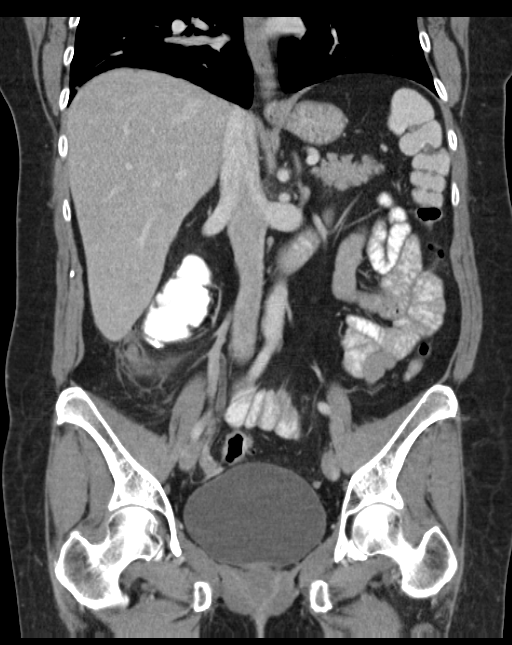

[15 of 46 positions shown; findings below may reference images not displayed]

FINDINGS: Lower chest:  The included lung bases are clear.

Liver: Mild steatosis. Subcapsular 1.7 cm peripherally enhancing
lesion in the right lobe consistent with hemangioma. 81.4 cm
hypodense lesion adjacent the gallbladder fossa with peripheral
nodular enhancement, also likely hemangioma. Smaller subcentimeter
hypodensities scattered throughout the liver parenchyma,
nonspecific.

Hepatobiliary: Gallbladder physiologically distended. At least 2
lamellated gallstones within the gallbladder fossa. No
pericholecystic inflammatory change. No biliary dilatation.

Pancreas: Normal.

Spleen: Normal.

Adrenal glands: No nodule.

Kidneys: Symmetric renal enhancement. No hydronephrosis. Mild
prominence of the right ureter, likely reactive. Tiny subcapsular
hypodensity in the interpolar right kidney, too small to
characterize.

Stomach/Bowel: Stomach physiologically distended. There are no
dilated or thickened small bowel loops. Small volume of stool
throughout the colon without colonic wall thickening. Scattered
diverticulosis throughout the distal colon without diverticulitis.

Appendix: Distended measuring up to 12 mm with questionable
appendicolith at the base. There is moderate surrounding
periappendiceal fat stranding and small amount of free fluid. Small
foci of air within the appendiceal lumen without frank perforation
or extraluminal air. No abscess.

Vascular/Lymphatic: No retroperitoneal adenopathy. Abdominal aorta
is normal in caliber. Mild atherosclerosis without aneurysm.

Reproductive: Uterus is normal in size. Left ovarian enlargement
measuring 4.3 x 3.2 cm with septated cyst versus adjacent cysts.
Right ovary normal in size.

Bladder: Physiologically distended without wall thickening.

Other: No free air or intra-abdominal fluid collection. Tiny fat
containing umbilical hernia.

Musculoskeletal: There are no acute or suspicious osseous
abnormalities. Facet arthropathy in the lumbar spine.
IMPRESSION: 1. Acute appendicitis.  No abscess or perforation.
2. Left ovarian enlargement with septated cyst versus adjacent
cysts. Recommend pelvic ultrasound for further characterization
after acute illness has resolved.
3. Incidental findings include cholelithiasis, no findings of acute
cholecystitis. Diverticulosis. Hepatic hemangiomas.
